# Patient Record
Sex: Female | Born: 1955 | Race: White | Hispanic: No | Smoking: Current every day smoker
Health system: Southern US, Community
[De-identification: ages and names within clinical notes are randomized; demographics above are authoritative.]

## PROBLEM LIST (undated history)

## (undated) DIAGNOSIS — R0683 Snoring: Secondary | ICD-10-CM

## (undated) DIAGNOSIS — J449 Chronic obstructive pulmonary disease, unspecified: Secondary | ICD-10-CM

## (undated) DIAGNOSIS — K219 Gastro-esophageal reflux disease without esophagitis: Secondary | ICD-10-CM

## (undated) DIAGNOSIS — J189 Pneumonia, unspecified organism: Secondary | ICD-10-CM

## (undated) DIAGNOSIS — E039 Hypothyroidism, unspecified: Secondary | ICD-10-CM

## (undated) DIAGNOSIS — F329 Major depressive disorder, single episode, unspecified: Secondary | ICD-10-CM

## (undated) DIAGNOSIS — J302 Other seasonal allergic rhinitis: Secondary | ICD-10-CM

## (undated) DIAGNOSIS — E785 Hyperlipidemia, unspecified: Secondary | ICD-10-CM

## (undated) DIAGNOSIS — M797 Fibromyalgia: Secondary | ICD-10-CM

## (undated) DIAGNOSIS — R079 Chest pain, unspecified: Secondary | ICD-10-CM

## (undated) DIAGNOSIS — F32A Depression, unspecified: Secondary | ICD-10-CM

## (undated) DIAGNOSIS — F419 Anxiety disorder, unspecified: Secondary | ICD-10-CM

## (undated) DIAGNOSIS — I82409 Acute embolism and thrombosis of unspecified deep veins of unspecified lower extremity: Secondary | ICD-10-CM

## (undated) DIAGNOSIS — J40 Bronchitis, not specified as acute or chronic: Secondary | ICD-10-CM

## (undated) HISTORY — PX: SHOULDER SURGERY: SHX246

## (undated) HISTORY — DX: Major depressive disorder, single episode, unspecified: F32.9

## (undated) HISTORY — PX: CHOLECYSTECTOMY: SHX55

## (undated) HISTORY — DX: Depression, unspecified: F32.A

## (undated) HISTORY — PX: CERVICAL SPINE SURGERY: SHX589

## (undated) HISTORY — DX: Other seasonal allergic rhinitis: J30.2

## (undated) HISTORY — PX: TOTAL ABDOMINAL HYSTERECTOMY: SHX209

## (undated) HISTORY — DX: Acute embolism and thrombosis of unspecified deep veins of unspecified lower extremity: I82.409

## (undated) HISTORY — PX: FOOT SURGERY: SHX648

## (undated) HISTORY — DX: Snoring: R06.83

## (undated) HISTORY — PX: TOTAL KNEE ARTHROPLASTY: SHX125

## (undated) HISTORY — PX: CARPAL TUNNEL RELEASE: SHX101

## (undated) HISTORY — DX: Hypothyroidism, unspecified: E03.9

## (undated) HISTORY — DX: Chest pain, unspecified: R07.9

## (undated) HISTORY — PX: KNEE ARTHROSCOPY: SUR90

## (undated) HISTORY — PX: ELBOW SURGERY: SHX618

## (undated) HISTORY — DX: Hyperlipidemia, unspecified: E78.5

---

## 2013-07-12 ENCOUNTER — Emergency Department (HOSPITAL_COMMUNITY)
Admission: EM | Admit: 2013-07-12 | Discharge: 2013-07-13 | Disposition: A | Discharge status: Home / Self Care | Payer: BC Managed Care – PPO | Attending: Emergency Medicine | Admitting: Emergency Medicine

## 2013-07-12 ENCOUNTER — Encounter (HOSPITAL_COMMUNITY): Payer: Self-pay | Admitting: Emergency Medicine

## 2013-07-12 DIAGNOSIS — E079 Disorder of thyroid, unspecified: Secondary | ICD-10-CM | POA: Insufficient documentation

## 2013-07-12 DIAGNOSIS — Y9339 Activity, other involving climbing, rappelling and jumping off: Secondary | ICD-10-CM | POA: Insufficient documentation

## 2013-07-12 DIAGNOSIS — Z8719 Personal history of other diseases of the digestive system: Secondary | ICD-10-CM | POA: Insufficient documentation

## 2013-07-12 DIAGNOSIS — F17200 Nicotine dependence, unspecified, uncomplicated: Secondary | ICD-10-CM | POA: Insufficient documentation

## 2013-07-12 DIAGNOSIS — S46909A Unspecified injury of unspecified muscle, fascia and tendon at shoulder and upper arm level, unspecified arm, initial encounter: Secondary | ICD-10-CM | POA: Insufficient documentation

## 2013-07-12 DIAGNOSIS — S0003XA Contusion of scalp, initial encounter: Secondary | ICD-10-CM | POA: Insufficient documentation

## 2013-07-12 DIAGNOSIS — S199XXA Unspecified injury of neck, initial encounter: Secondary | ICD-10-CM

## 2013-07-12 DIAGNOSIS — S1093XA Contusion of unspecified part of neck, initial encounter: Secondary | ICD-10-CM

## 2013-07-12 DIAGNOSIS — W1809XA Striking against other object with subsequent fall, initial encounter: Secondary | ICD-10-CM | POA: Insufficient documentation

## 2013-07-12 DIAGNOSIS — S0083XA Contusion of other part of head, initial encounter: Secondary | ICD-10-CM | POA: Insufficient documentation

## 2013-07-12 DIAGNOSIS — S0993XA Unspecified injury of face, initial encounter: Secondary | ICD-10-CM | POA: Insufficient documentation

## 2013-07-12 DIAGNOSIS — Y929 Unspecified place or not applicable: Secondary | ICD-10-CM | POA: Insufficient documentation

## 2013-07-12 DIAGNOSIS — IMO0002 Reserved for concepts with insufficient information to code with codable children: Secondary | ICD-10-CM | POA: Insufficient documentation

## 2013-07-12 DIAGNOSIS — S0990XA Unspecified injury of head, initial encounter: Secondary | ICD-10-CM

## 2013-07-12 DIAGNOSIS — S79929A Unspecified injury of unspecified thigh, initial encounter: Secondary | ICD-10-CM | POA: Insufficient documentation

## 2013-07-12 DIAGNOSIS — S79919A Unspecified injury of unspecified hip, initial encounter: Secondary | ICD-10-CM | POA: Insufficient documentation

## 2013-07-12 DIAGNOSIS — S4980XA Other specified injuries of shoulder and upper arm, unspecified arm, initial encounter: Secondary | ICD-10-CM | POA: Insufficient documentation

## 2013-07-12 DIAGNOSIS — Z79899 Other long term (current) drug therapy: Secondary | ICD-10-CM | POA: Insufficient documentation

## 2013-07-12 HISTORY — DX: Gastro-esophageal reflux disease without esophagitis: K21.9

## 2013-07-12 HISTORY — DX: Fibromyalgia: M79.7

## 2013-07-12 NOTE — ED Notes (Signed)
Pt tripped over a gate and fell face first in the wall, she complains of facial pain and knee pain, pt also complains of neck pain, had neck surgery in August.

## 2013-07-13 ENCOUNTER — Emergency Department (HOSPITAL_COMMUNITY): Payer: BC Managed Care – PPO

## 2013-07-13 MED ORDER — ONDANSETRON HCL 8 MG PO TABS: 8.0000 mg | ORAL_TABLET | Freq: Three times a day (TID) | ORAL | Status: DC | PRN

## 2013-07-13 MED ORDER — OXYCODONE-ACETAMINOPHEN 5-325 MG PO TABS: 1.0000 | ORAL_TABLET | ORAL | Status: DC | PRN

## 2013-07-13 MED ORDER — OXYCODONE-ACETAMINOPHEN 5-325 MG PO TABS
1.0000 | ORAL_TABLET | Freq: Once | ORAL | Status: AC
Start: 2013-07-13 — End: 2013-07-13
  Administered 2013-07-13: 1 via ORAL
  Filled 2013-07-13: qty 1

## 2013-07-13 MED ORDER — ONDANSETRON 8 MG PO TBDP
8.0000 mg | ORAL_TABLET | Freq: Once | ORAL | Status: AC
  Administered 2013-07-13: 8 mg via ORAL
  Filled 2013-07-13: qty 1

## 2013-07-13 NOTE — Discharge Instructions (Signed)
Use ice on the sore areas, 3 times a day. Return here, if needed, for problems.     Contusion A contusion is a deep bruise. Contusions are the result of an injury that caused bleeding under the skin. The contusion may turn blue, purple, or yellow. Minor injuries will give you a painless contusion, but more severe contusions may stay painful and swollen for a few weeks.  CAUSES  A contusion is usually caused by a blow, trauma, or direct force to an area of the body. SYMPTOMS   Swelling and redness of the injured area.  Bruising of the injured area.  Tenderness and soreness of the injured area.  Pain. DIAGNOSIS  The diagnosis can be made by taking a history and physical exam. An X-ray, CT scan, or MRI may be needed to determine if there were any associated injuries, such as fractures. TREATMENT  Specific treatment will depend on what area of the body was injured. In general, the best treatment for a contusion is resting, icing, elevating, and applying cold compresses to the injured area. Over-the-counter medicines may also be recommended for pain control. Ask your caregiver what the best treatment is for your contusion. HOME CARE INSTRUCTIONS   Put ice on the injured area.  Put ice in a plastic bag.  Place a towel between your skin and the bag.  Leave the ice on for 15-20 minutes, 03-04 times a day.  Only take over-the-counter or prescription medicines for pain, discomfort, or fever as directed by your caregiver. Your caregiver may recommend avoiding anti-inflammatory medicines (aspirin, ibuprofen, and naproxen) for 48 hours because these medicines may increase bruising.  Rest the injured area.  If possible, elevate the injured area to reduce swelling. SEEK IMMEDIATE MEDICAL CARE IF:   You have increased bruising or swelling.  You have pain that is getting worse.  Your swelling or pain is not relieved with medicines. MAKE SURE YOU:   Understand these instructions.  Will  watch your condition.  Will get help right away if you are not doing well or get worse. Document Released: 11/28/2004 Document Revised: 05/13/2011 Document Reviewed: 12/24/2010 [facility] Patient Information 2014 [place], [place].  Head Injury, Adult You have received a head injury. It does not appear serious at this time. Headaches and vomiting are common following head injury. It should be easy to awaken from sleeping. Sometimes it is necessary for you to stay in the emergency department for a while for observation. Sometimes admission to the hospital may be needed. After injuries such as yours, most problems occur within the first 24 hours, but side effects may occur up to 7 10 days after the injury. It is important for you to carefully monitor your condition and contact your health care provider or seek immediate medical care if there is a change in your condition. WHAT ARE THE TYPES OF HEAD INJURIES? Head injuries can be as minor as a bump. Some head injuries can be more severe. More severe head injuries include:  A jarring injury to the brain (concussion).  A bruise of the brain (contusion). This mean there is bleeding in the brain that can cause swelling.  A cracked skull (skull fracture).  Bleeding in the brain that collects, clots, and forms a bump (hematoma). WHAT CAUSES A HEAD INJURY? A serious head injury is most likely to happen to someone who is in a car wreck and is not wearing a seat belt. Other causes of major head injuries include bicycle or motorcycle accidents, sports  injuries, and falls. HOW ARE HEAD INJURIES DIAGNOSED? A complete history of the event leading to the injury and your current symptoms will be helpful in diagnosing head injuries. Many times, pictures of the brain, such as CT or MRI are needed to see the extent of the injury. Often, an overnight hospital stay is necessary for observation.  WHEN SHOULD I SEEK IMMEDIATE MEDICAL CARE?  You should get help right away  if:  You have confusion or drowsiness.  You feel sick to your stomach (nauseous) or have continued, forceful vomiting.  You have dizziness or unsteadiness that is getting worse.  You have severe, continued headaches not relieved by medicine. Only take over-the-counter or prescription medicines for pain, fever, or discomfort as directed by your health care provider.  You do not have normal function of the arms or legs or are unable to walk.  You notice changes in the black spots in the center of the colored part of your eye (pupil).  You have a clear or bloody fluid coming from your nose or ears.  You have a loss of vision. During the next 24 hours after the injury, you must stay with someone who can watch you for the warning signs. This person should contact local emergency services (911 in the [place] if you have seizures, you become unconscious, or you are unable to wake up. HOW CAN I PREVENT A HEAD INJURY IN THE FUTURE? The most important factor for preventing major head injuries is avoiding motor vehicle accidents. To minimize the potential for damage to your head, it is crucial to wear seat belts while riding in motor vehicles. Wearing helmets while bike riding and playing collision sports (like football) is also helpful. Also, avoiding dangerous activities around the house will further help reduce your risk of head injury.  WHEN CAN I RETURN TO NORMAL ACTIVITIES AND ATHLETICS? You should be reevaluated by your health care provider before returning to these activities. If you have any of the following symptoms, you should not return to activities or contact sports until 1 week after the symptoms have stopped:  Persistent headache.  Dizziness or vertigo.  Poor attention and concentration.  Confusion.  Memory problems.  Nausea or vomiting.  Fatigue or tire easily.  Irritability.  Intolerant of bright lights or loud noises.  Anxiety or depression.  Disturbed sleep. MAKE  SURE YOU:   Understand these instructions.  Will watch your condition.  Will get help right away if you are not doing well or get worse. Document Released: 02/18/2005 Document Revised: 12/09/2012 Document Reviewed: 10/26/2012 [facility] Patient Information 2014 [place].

## 2013-07-13 NOTE — ED Provider Notes (Signed)
CSN: [id number]     Arrival date & time 07/12/13  2318 History   First MD Initiated Contact with Patient 07/13/13 0151     Chief Complaint  Patient presents with  . Fall  . Facial Injury  . Headache     (Consider location/radiation/quality/duration/timing/severity/associated sxs/prior Treatment) Patient is a 58 y.o. female presenting with fall, facial injury, and headaches. The history is provided by the patient.  Fall Associated symptoms include headaches.  Facial Injury Associated symptoms: headaches   Headache  She reports that she was climbing over a dog gate, when she stumbled forward, striking her face and head on a wall. She injured her right shoulder and left knee in the fall. She has pain in her face for head and neck. She denies weakness, or paresthesias. She did not take anything for the pain. There are no other known modifying factors.  Past Medical History  Diagnosis Date  . Thyroid disease   . GERD (gastroesophageal reflux disease)   . Allergy   . Fibromyalgia    Past Surgical History  Procedure Laterality Date  . Neck surgery     History reviewed. No pertinent family history. History  Substance Use Topics  . Smoking status: Current Every Day Smoker  . Smokeless tobacco: Not on file  . Alcohol Use: No   OB History   Grav Para Term Preterm Abortions TAB SAB Ect Mult Living                 Review of Systems  Neurological: Positive for headaches.  All other systems reviewed and are negative.     Allergies  Tetracyclines & related  Home Medications   Prior to Admission medications   Medication Sig Start Date End Date Taking? Authorizing Provider  diazepam (VALIUM) 10 MG tablet Take 10 mg by mouth every 6 (six) hours as needed for anxiety.   Yes Historical Provider, MD  levothyroxine (SYNTHROID, LEVOTHROID) 100 MCG tablet Take 100 mcg by mouth daily before breakfast.   Yes Historical Provider, MD  oxyCODONE-acetaminophen (PERCOCET) 10-325 MG per  tablet Take 1 tablet by mouth every 4 (four) hours as needed for pain.   Yes Historical Provider, MD   BP 109/62  Pulse 57  Temp(Src) 99.8 F (37.7 C) (Oral)  Resp 16  SpO2 99% Physical Exam  Nursing note and vitals reviewed. Constitutional: She is oriented to person, place, and time. She appears well-developed and well-nourished. She appears distressed (she is uncomfortable).  HENT:  Head: Normocephalic and atraumatic.  Mild swelling of nose, no deformity, no bleeding. Tender cheeks bilaterally, without deformity or crepitation  Eyes: Conjunctivae and EOM are normal. Pupils are equal, round, and reactive to light. No scleral icterus.  No hyphema  Neck: Normal range of motion and phonation normal. Neck supple.  Cardiovascular: Normal rate, regular rhythm and intact distal pulses.   Pulmonary/Chest: Effort normal and breath sounds normal. No respiratory distress. She exhibits no tenderness.  Abdominal: Soft. She exhibits no distension. There is no tenderness. There is no guarding.  Musculoskeletal: Normal range of motion.  Normal range of motion of arms, and legs.  Neurological: She is alert and oriented to person, place, and time. She exhibits normal muscle tone.  Skin: Skin is warm and dry.  Superficial abrasions, small, nose, and left cheek  Psychiatric: She has a normal mood and affect. Her behavior is normal. Judgment and thought content normal.    ED Course  Procedures (including critical care time) Medications  oxyCODONE-acetaminophen (PERCOCET/ROXICET)  5-325 MG per tablet 1 tablet (1 tablet Oral Given 07/13/13 0236)  ondansetron (ZOFRAN-ODT) disintegrating tablet 8 mg (8 mg Oral Given 07/13/13 0236)    Patient Vitals for the past 24 hrs:  BP Temp Temp src Pulse Resp SpO2  07/12/13 2327 109/62 mmHg 99.8 F (37.7 C) Oral 57 16 99 %    3:33 AM Reevaluation with update and discussion. After initial assessment and treatment, an updated evaluation reveals she is comfortable.  No further complaints. Findings discussed with patient, all questions answered. [place] Review Labs Reviewed - No data to display  Imaging Review Ct Head Wo Contrast  07/13/2013   CLINICAL DATA:  Trip and fall, nasal injury with facial pain. Neck surgery August 2014.  EXAM: CT HEAD WITHOUT CONTRAST  CT MAXILLOFACIAL WITHOUT CONTRAST  CT CERVICAL SPINE WITHOUT CONTRAST  TECHNIQUE: Multidetector CT imaging of the head, cervical spine, and maxillofacial structures were performed using the standard protocol without intravenous contrast. Multiplanar CT image reconstructions of the cervical spine and maxillofacial structures were also generated.  COMPARISON:  None.  FINDINGS: CT HEAD FINDINGS  The ventricles and sulci are normal. No intraparenchymal hemorrhage, mass effect nor midline shift. No acute large vascular territory infarcts. Sub cm left inferior basilar ganglia perivascular space.  No abnormal extra-axial fluid collections. Basal cisterns are patent. No skull fracture.  CT MAXILLOFACIAL FINDINGS  The mandible is intact, the condyles are located.  Bilateral nasal bone fractures, mildly displaced on the right. No additional facial fractures. Absent ninth tooth.  Lobulated paranasal sinus mucosal thickening without air-fluid levels. Visualized mastoid air cells are well aerated. Nasal septum slightly deviated to the right with a tiny bony spur, left concha bullosa.  Mild midface soft tissue swelling without subcutaneous gas or radiopaque foreign bodies. Ocular globes and orbital contents are unremarkable.  CT CERVICAL SPINE FINDINGS  Status post C5-6 and C6-7 ACDF, partially incorporated interbody disc material, hardware appears intact and well seated without periprosthetic lucency. The vertebral bodies and posterior elements appear intact and aligned with straightened cervical lordosis. Mild C4-5 degenerative disc disease. No destructive bony lesions. 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 The included prevertebral paraspinal soft tissues are nonsuspicious.  IMPRESSION: CT head: No acute intracranial process ; normal noncontrast CT of the head for age.  CT maxillofacial: Bilateral nasal bone fractures, mildly displaced on the right. Absent ninth tooth may be acute.  Chronic paranasal sinusitis or possible superimposed polyposis.  CT cervical spine:  No acute fracture nor malalignment.  Status post C5-6 and C6-7 ACDF.   Electronically Signed   By: Elon Alas   On: 07/13/2013 03:03   Ct Cervical Spine Wo Contrast  07/13/2013   CLINICAL DATA:  Trip and fall, nasal injury with facial pain. Neck surgery August 2014.  EXAM: CT HEAD WITHOUT CONTRAST  CT MAXILLOFACIAL WITHOUT CONTRAST  CT CERVICAL SPINE WITHOUT CONTRAST  TECHNIQUE: Multidetector CT imaging of the head, cervical spine, and maxillofacial structures were performed using the standard protocol without intravenous contrast. Multiplanar CT image reconstructions of the cervical spine and maxillofacial structures were also generated.  COMPARISON:  None.  FINDINGS: CT HEAD FINDINGS  The ventricles and sulci are normal. No intraparenchymal hemorrhage, mass effect nor midline shift. No acute large vascular territory infarcts. Sub cm left inferior basilar ganglia perivascular space.  No abnormal extra-axial fluid collections. Basal cisterns are patent. No skull fracture.  CT MAXILLOFACIAL FINDINGS  The mandible is  intact, the condyles are located.  Bilateral nasal bone fractures, mildly displaced on the right. No additional facial fractures. Absent ninth tooth.  Lobulated paranasal sinus mucosal thickening without air-fluid levels. Visualized mastoid air cells are well aerated. Nasal septum slightly deviated to the right with a tiny bony spur, left concha bullosa.  Mild midface soft tissue swelling without subcutaneous gas or radiopaque foreign bodies. Ocular globes and orbital  contents are unremarkable.  CT CERVICAL SPINE FINDINGS  Status post C5-6 and C6-7 ACDF, partially incorporated interbody disc material, hardware appears intact and well seated without periprosthetic lucency. The vertebral bodies and posterior elements appear intact and aligned with straightened cervical lordosis. Mild C4-5 degenerative disc disease. No destructive bony lesions. Right C1 transverse process articulates with the skullbase with arthropathy, congenital variant. The included prevertebral paraspinal soft tissues are nonsuspicious.  IMPRESSION: CT head: No acute intracranial process ; normal noncontrast CT of the head for age.  CT maxillofacial: Bilateral nasal bone fractures, mildly displaced on the right. Absent ninth tooth may be acute.  Chronic paranasal sinusitis or possible superimposed polyposis.  CT cervical spine:  No acute fracture nor malalignment.  Status post C5-6 and C6-7 ACDF.   Electronically Signed   By: Elon Alas   On: 07/13/2013 03:03   Ct Maxillofacial Wo Cm  07/13/2013   CLINICAL DATA:  Trip and fall, nasal injury with facial pain. Neck surgery August 2014.  EXAM: CT HEAD WITHOUT CONTRAST  CT MAXILLOFACIAL WITHOUT CONTRAST  CT CERVICAL SPINE WITHOUT CONTRAST  TECHNIQUE: Multidetector CT imaging of the head, cervical spine, and maxillofacial structures were performed using the standard protocol without intravenous contrast. Multiplanar CT image reconstructions of the cervical spine and maxillofacial structures were also generated.  COMPARISON:  None.  FINDINGS: CT HEAD FINDINGS  The ventricles and sulci are normal. No intraparenchymal hemorrhage, mass effect nor midline shift. No acute large vascular territory infarcts. Sub cm left inferior basilar ganglia perivascular space.  No abnormal extra-axial fluid collections. Basal cisterns are patent. No skull fracture.  CT MAXILLOFACIAL FINDINGS  The mandible is intact, the condyles are located.  Bilateral nasal bone fractures,  mildly displaced on the right. No additional facial fractures. Absent ninth tooth.  Lobulated paranasal sinus mucosal thickening without air-fluid levels. Visualized mastoid air cells are well aerated. Nasal septum slightly deviated to the right with a tiny bony spur, left concha bullosa.  Mild midface soft tissue swelling without subcutaneous gas or radiopaque foreign bodies. Ocular globes and orbital contents are unremarkable.  CT CERVICAL SPINE FINDINGS  Status post C5-6 and C6-7 ACDF, partially incorporated interbody disc material, hardware appears intact and well seated without periprosthetic lucency. The vertebral bodies and posterior elements appear intact and aligned with straightened cervical lordosis. Mild C4-5 degenerative disc disease. No destructive bony lesions. Right C1 transverse process articulates with the skullbase with arthropathy, congenital variant. The included prevertebral paraspinal soft tissues are nonsuspicious.  IMPRESSION: CT head: No acute intracranial process ; normal noncontrast CT of the head for age.  CT maxillofacial: Bilateral nasal bone fractures, mildly displaced on the right. Absent ninth tooth may be acute.  Chronic paranasal sinusitis or possible superimposed polyposis.  CT cervical spine:  No acute fracture nor malalignment.  Status post C5-6 and C6-7 ACDF.   Electronically Signed   By: Elon Alas   On: 07/13/2013 03:03     EKG Interpretation None      MDM   Final diagnoses:  Head injury  Contusion of face  Neck injury  Mechanical fall, without, serious injury.   Nursing Notes Reviewed/ Care Coordinated Applicable Imaging Reviewed Interpretation of Laboratory Data incorporated into ED treatment  The patient appears reasonably screened and/or stabilized for discharge and I doubt any other medical condition or other [facility] requiring further screening, evaluation, or treatment in the ED at this time prior to discharge.  Plan: Home Medications-  usual; Home Treatments- cryotherapy; return here if the recommended treatment, does not improve the symptoms; Recommended follow up- return, if needed.    Richarda Blade, MD 07/13/13 [phone]

## 2013-07-13 NOTE — ED Notes (Signed)
Pt request Rx for pain and nausea, MD to be made aware.

## 2015-03-22 DIAGNOSIS — Z5321 Procedure and treatment not carried out due to patient leaving prior to being seen by health care provider: Secondary | ICD-10-CM | POA: Diagnosis not present

## 2015-03-22 DIAGNOSIS — I498 Other specified cardiac arrhythmias: Secondary | ICD-10-CM | POA: Diagnosis not present

## 2015-03-22 DIAGNOSIS — R0602 Shortness of breath: Secondary | ICD-10-CM | POA: Diagnosis not present

## 2015-03-23 DIAGNOSIS — F17200 Nicotine dependence, unspecified, uncomplicated: Secondary | ICD-10-CM | POA: Diagnosis not present

## 2015-03-23 DIAGNOSIS — F3289 Other specified depressive episodes: Secondary | ICD-10-CM | POA: Diagnosis not present

## 2015-03-23 DIAGNOSIS — Z98870 Personal history of in utero procedure during pregnancy: Secondary | ICD-10-CM | POA: Diagnosis not present

## 2015-03-23 DIAGNOSIS — Z9889 Other specified postprocedural states: Secondary | ICD-10-CM | POA: Diagnosis not present

## 2015-03-23 DIAGNOSIS — Z9049 Acquired absence of other specified parts of digestive tract: Secondary | ICD-10-CM | POA: Diagnosis not present

## 2015-03-28 DIAGNOSIS — F329 Major depressive disorder, single episode, unspecified: Secondary | ICD-10-CM | POA: Diagnosis not present

## 2015-03-28 DIAGNOSIS — G894 Chronic pain syndrome: Secondary | ICD-10-CM | POA: Diagnosis not present

## 2015-03-28 DIAGNOSIS — M5412 Radiculopathy, cervical region: Secondary | ICD-10-CM | POA: Diagnosis not present

## 2015-03-28 DIAGNOSIS — Z79899 Other long term (current) drug therapy: Secondary | ICD-10-CM | POA: Diagnosis not present

## 2015-03-28 DIAGNOSIS — F419 Anxiety disorder, unspecified: Secondary | ICD-10-CM | POA: Diagnosis not present

## 2015-04-04 DIAGNOSIS — F410 Panic disorder [episodic paroxysmal anxiety] without agoraphobia: Secondary | ICD-10-CM | POA: Diagnosis not present

## 2015-04-12 DIAGNOSIS — M5010 Cervical disc disorder with radiculopathy, unspecified cervical region: Secondary | ICD-10-CM | POA: Diagnosis not present

## 2015-04-12 DIAGNOSIS — M961 Postlaminectomy syndrome, not elsewhere classified: Secondary | ICD-10-CM | POA: Diagnosis not present

## 2015-04-12 DIAGNOSIS — M542 Cervicalgia: Secondary | ICD-10-CM | POA: Diagnosis not present

## 2015-04-12 DIAGNOSIS — M5013 Cervical disc disorder with radiculopathy, cervicothoracic region: Secondary | ICD-10-CM | POA: Diagnosis not present

## 2015-05-08 DIAGNOSIS — M5010 Cervical disc disorder with radiculopathy, unspecified cervical region: Secondary | ICD-10-CM | POA: Diagnosis not present

## 2015-05-08 DIAGNOSIS — M542 Cervicalgia: Secondary | ICD-10-CM | POA: Diagnosis not present

## 2015-05-08 DIAGNOSIS — M961 Postlaminectomy syndrome, not elsewhere classified: Secondary | ICD-10-CM | POA: Diagnosis not present

## 2015-05-18 DIAGNOSIS — Z5181 Encounter for therapeutic drug level monitoring: Secondary | ICD-10-CM | POA: Diagnosis not present

## 2015-05-18 DIAGNOSIS — Z79899 Other long term (current) drug therapy: Secondary | ICD-10-CM | POA: Diagnosis not present

## 2015-05-19 DIAGNOSIS — R079 Chest pain, unspecified: Secondary | ICD-10-CM | POA: Diagnosis not present

## 2015-05-19 DIAGNOSIS — G629 Polyneuropathy, unspecified: Secondary | ICD-10-CM | POA: Diagnosis not present

## 2015-05-19 DIAGNOSIS — R9431 Abnormal electrocardiogram [ECG] [EKG]: Secondary | ICD-10-CM | POA: Diagnosis not present

## 2015-05-19 DIAGNOSIS — F419 Anxiety disorder, unspecified: Secondary | ICD-10-CM | POA: Diagnosis not present

## 2015-05-19 DIAGNOSIS — Z1159 Encounter for screening for other viral diseases: Secondary | ICD-10-CM | POA: Diagnosis not present

## 2015-05-21 DIAGNOSIS — E785 Hyperlipidemia, unspecified: Secondary | ICD-10-CM | POA: Diagnosis not present

## 2015-05-21 DIAGNOSIS — R0789 Other chest pain: Secondary | ICD-10-CM | POA: Diagnosis not present

## 2015-05-21 DIAGNOSIS — Z981 Arthrodesis status: Secondary | ICD-10-CM | POA: Diagnosis not present

## 2015-05-21 DIAGNOSIS — R0602 Shortness of breath: Secondary | ICD-10-CM | POA: Diagnosis not present

## 2015-05-21 DIAGNOSIS — R079 Chest pain, unspecified: Secondary | ICD-10-CM | POA: Diagnosis not present

## 2015-05-21 DIAGNOSIS — E039 Hypothyroidism, unspecified: Secondary | ICD-10-CM | POA: Diagnosis not present

## 2015-05-21 DIAGNOSIS — J9811 Atelectasis: Secondary | ICD-10-CM | POA: Diagnosis not present

## 2015-05-23 DIAGNOSIS — M961 Postlaminectomy syndrome, not elsewhere classified: Secondary | ICD-10-CM | POA: Diagnosis not present

## 2015-05-23 DIAGNOSIS — M542 Cervicalgia: Secondary | ICD-10-CM | POA: Diagnosis not present

## 2015-05-23 DIAGNOSIS — M5013 Cervical disc disorder with radiculopathy, cervicothoracic region: Secondary | ICD-10-CM | POA: Diagnosis not present

## 2015-06-12 DIAGNOSIS — R9431 Abnormal electrocardiogram [ECG] [EKG]: Secondary | ICD-10-CM | POA: Diagnosis not present

## 2015-06-20 DIAGNOSIS — Z1212 Encounter for screening for malignant neoplasm of rectum: Secondary | ICD-10-CM | POA: Diagnosis not present

## 2015-06-20 DIAGNOSIS — Z1211 Encounter for screening for malignant neoplasm of colon: Secondary | ICD-10-CM | POA: Diagnosis not present

## 2015-06-20 DIAGNOSIS — F329 Major depressive disorder, single episode, unspecified: Secondary | ICD-10-CM | POA: Diagnosis not present

## 2015-06-20 DIAGNOSIS — E538 Deficiency of other specified B group vitamins: Secondary | ICD-10-CM | POA: Diagnosis not present

## 2015-06-20 DIAGNOSIS — Z1231 Encounter for screening mammogram for malignant neoplasm of breast: Secondary | ICD-10-CM | POA: Diagnosis not present

## 2015-06-20 DIAGNOSIS — G894 Chronic pain syndrome: Secondary | ICD-10-CM | POA: Diagnosis not present

## 2015-06-20 DIAGNOSIS — M797 Fibromyalgia: Secondary | ICD-10-CM | POA: Diagnosis not present

## 2015-06-20 DIAGNOSIS — G629 Polyneuropathy, unspecified: Secondary | ICD-10-CM | POA: Diagnosis not present

## 2015-06-23 DIAGNOSIS — Z1231 Encounter for screening mammogram for malignant neoplasm of breast: Secondary | ICD-10-CM | POA: Diagnosis not present

## 2015-08-07 DIAGNOSIS — F329 Major depressive disorder, single episode, unspecified: Secondary | ICD-10-CM | POA: Diagnosis not present

## 2015-08-07 DIAGNOSIS — G629 Polyneuropathy, unspecified: Secondary | ICD-10-CM | POA: Diagnosis not present

## 2015-08-07 DIAGNOSIS — E7800 Pure hypercholesterolemia, unspecified: Secondary | ICD-10-CM | POA: Diagnosis not present

## 2015-08-07 DIAGNOSIS — E039 Hypothyroidism, unspecified: Secondary | ICD-10-CM | POA: Diagnosis not present

## 2015-08-07 DIAGNOSIS — Z79899 Other long term (current) drug therapy: Secondary | ICD-10-CM | POA: Diagnosis not present

## 2015-08-07 DIAGNOSIS — G894 Chronic pain syndrome: Secondary | ICD-10-CM | POA: Diagnosis not present

## 2015-10-02 DIAGNOSIS — K580 Irritable bowel syndrome with diarrhea: Secondary | ICD-10-CM | POA: Diagnosis not present

## 2015-10-02 DIAGNOSIS — F329 Major depressive disorder, single episode, unspecified: Secondary | ICD-10-CM | POA: Diagnosis not present

## 2015-10-02 DIAGNOSIS — G894 Chronic pain syndrome: Secondary | ICD-10-CM | POA: Diagnosis not present

## 2015-11-22 DIAGNOSIS — M5136 Other intervertebral disc degeneration, lumbar region: Secondary | ICD-10-CM | POA: Diagnosis not present

## 2015-11-22 DIAGNOSIS — M797 Fibromyalgia: Secondary | ICD-10-CM | POA: Diagnosis not present

## 2015-11-22 DIAGNOSIS — G894 Chronic pain syndrome: Secondary | ICD-10-CM | POA: Diagnosis not present

## 2015-11-22 DIAGNOSIS — E039 Hypothyroidism, unspecified: Secondary | ICD-10-CM | POA: Diagnosis not present

## 2015-11-22 DIAGNOSIS — F411 Generalized anxiety disorder: Secondary | ICD-10-CM | POA: Diagnosis not present

## 2015-11-28 DIAGNOSIS — Z23 Encounter for immunization: Secondary | ICD-10-CM | POA: Diagnosis not present

## 2015-12-20 ENCOUNTER — Encounter (HOSPITAL_COMMUNITY): Payer: Self-pay | Admitting: Emergency Medicine

## 2015-12-20 ENCOUNTER — Emergency Department (HOSPITAL_COMMUNITY)
Admission: EM | Admit: 2015-12-20 | Discharge: 2015-12-20 | Disposition: A | Discharge status: Home / Self Care | Payer: Medicare Other | Attending: Emergency Medicine | Admitting: Emergency Medicine

## 2015-12-20 DIAGNOSIS — F1193 Opioid use, unspecified with withdrawal: Secondary | ICD-10-CM

## 2015-12-20 DIAGNOSIS — F1123 Opioid dependence with withdrawal: Secondary | ICD-10-CM | POA: Diagnosis present

## 2015-12-20 DIAGNOSIS — F17200 Nicotine dependence, unspecified, uncomplicated: Secondary | ICD-10-CM | POA: Diagnosis not present

## 2015-12-20 MED ORDER — CARBAMAZEPINE 200 MG PO TABS: ORAL_TABLET | ORAL | 0 refills | Status: DC

## 2015-12-20 MED ORDER — CARBAMAZEPINE 200 MG PO TABS
200.0000 mg | ORAL_TABLET | Freq: Once | ORAL | Status: AC
  Administered 2015-12-20: 200 mg via ORAL
  Filled 2015-12-20: qty 1

## 2015-12-20 NOTE — ED Provider Notes (Signed)
[facility] DEPT Provider Note   CSN: [id number] Arrival date & time: 12/20/15  1516  By signing my name below, I, Jeanell Sparrow, attest that this documentation has been prepared under the direction and in the presence of non-physician practitioner, Domenic Moras, PA-C. Electronically Signed: Jeanell Sparrow, Scribe. 12/20/2015. 5:10 PM.  History   Chief Complaint Chief Complaint  Patient presents with  . withdrawl   The history is provided by the patient. No language interpreter was used.   HPI Comments: Alexandra Harvey is a 60 y.o. female with a hx of fibromyalgia who presents to the Emergency Department complaining of opioid withdrawal. She reports that she has been taking pain medication for fibromyalgia and has been attempting to ween herself off of it. She came to the ED for help with her addiction. She reports associated symptoms of generalized myalgia, nausea, agitation, and visual hallucinations. She states that sleep usually relieves her fibromyalgia episodes. She denies any SI, HI, or alcohol abuse.   Past Medical History:  Diagnosis Date  . Allergy   . Fibromyalgia   . GERD (gastroesophageal reflux disease)   . Thyroid disease     There are no active problems to display for this patient.   Past Surgical History:  Procedure Laterality Date  . NECK SURGERY      OB History    No data available       Home Medications    Prior to Admission medications   Medication Sig Start Date End Date Taking? Authorizing Provider  carbamazepine (TEGRETOL) 200 MG tablet 800mg  PO QD X 1D, then 600mg  PO QD X 1D, then 400mg  QD X 1D, then 200mg  PO QD X 2D 12/20/15   Domenic Moras, PA-C  diazepam (VALIUM) 10 MG tablet Take 10 mg by mouth every 6 (six) hours as needed for anxiety.    Historical Provider, MD  levothyroxine (SYNTHROID, LEVOTHROID) 100 MCG tablet Take 100 mcg by mouth daily before breakfast.    Historical Provider, MD  ondansetron (ZOFRAN) 8 MG tablet Take 1 tablet (8 mg  total) by mouth every 8 (eight) hours as needed for nausea or vomiting. 07/13/13   Daleen Bo, MD  oxyCODONE-acetaminophen (PERCOCET) 10-325 MG per tablet Take 1 tablet by mouth every 4 (four) hours as needed for pain.    Historical Provider, MD  oxyCODONE-acetaminophen (PERCOCET) 5-325 MG per tablet Take 1 tablet by mouth every 4 (four) hours as needed for severe pain. 07/13/13   Daleen Bo, MD    Family History No family history on file.  Social History Social History  Substance Use Topics  . Smoking status: Current Every Day Smoker    Packs/day: 0.50  . Smokeless tobacco: Never Used  . Alcohol use No     Allergies   Tetracyclines & related   Review of Systems Review of Systems  Gastrointestinal: Positive for nausea.  Musculoskeletal: Positive for myalgias (Generalized).  Psychiatric/Behavioral: Positive for hallucinations (Visual). Negative for suicidal ideas.     Physical Exam Updated Vital Signs BP 151/81 (BP Location: Left Arm)   Pulse 115   Temp 98 F (36.7 C) (Oral)   Resp 18   Ht 5\' 7"  (1.702 m)   Wt 167 lb 8 oz (76 kg)   SpO2 99%   BMI 26.23 kg/m   Physical Exam  Constitutional: She appears well-developed and well-nourished. No distress.  HENT:  Head: Normocephalic.  Eyes: Conjunctivae are normal.  Neck: Neck supple.  Cardiovascular: Normal rate and regular rhythm.   Pulmonary/Chest: Effort  normal. No respiratory distress. She has no wheezes. She has no rales.  Abdominal: Soft.  Musculoskeletal: Normal range of motion.  Diffuse tenderness all throughout body to mild palpation.   Neurological: She is alert.  Skin: Skin is warm and dry.  Psychiatric: She has a normal mood and affect.  Nursing note and vitals reviewed.    ED Treatments / Results  DIAGNOSTIC STUDIES: Oxygen Saturation is 99% on RA, normal by my interpretation.    COORDINATION OF CARE: 5:20 PM- Pt advised of plan for treatment and pt agrees.  Labs (all labs ordered are  listed, but only abnormal results are displayed) Labs Reviewed - No data to display  EKG  EKG Interpretation None       Radiology No results found.  Procedures Procedures (including critical care time)  Medications Ordered in ED Medications - No data to display   Initial Impression / Assessment and Plan / ED Course  I have reviewed the triage vital signs and the nursing notes.  Pertinent labs & imaging results that were available during my care of the patient were reviewed by me and considered in my medical decision making (see chart for details).  Clinical Course    BP 151/81 (BP Location: Left Arm)   Pulse 115   Temp 98 F (36.7 C) (Oral)   Resp 18   Ht 5\' 7"  (1.702 m)   Wt 76 kg   SpO2 99%   BMI 26.23 kg/m    Final Clinical Impressions(s) / ED Diagnoses   Final diagnoses:  Opiate withdrawal (HCC)    New Prescriptions New Prescriptions   CARBAMAZEPINE (TEGRETOL) 200 MG TABLET    800mg  PO QD X 1D, then 600mg  PO QD X 1D, then 400mg  QD X 1D, then 200mg  PO QD X 2D   I personally performed the services described in this documentation, which was scribed in my presence. The recorded information has been reviewed and is accurate.   Pt request detox from opiate.  She is agreable to seek outpt care.  Will provide resources.  Tegretol given to help with withdrawal.    Domenic Moras, PA-C 12/21/15 [place], MD 12/22/15 1745

## 2015-12-20 NOTE — ED Triage Notes (Addendum)
Pt from home with complaints of opioid withdrawal. Pt states she wanted to not be addicted and she also just moved here and doesn't have a doctor that will prescribe her the pills. Pt states she stopped taking her valium and oxycodone 24 hours ago. Pt states she has a hx of fibromyalgia and "is currently having an attack". Pt is alert and oriented x 4 is not diaphoretic, but is tachycardic at 101 at time of assessment. Pt states she is having hallucinations of the floor moving and is "seeing colors", but denies SI and HI.  Pt immediately handed this RN a tin with her medications in them, then she asked for the tin back so she "could give it to the doctor to prove she hasn't taken them".

## 2015-12-22 ENCOUNTER — Encounter (HOSPITAL_COMMUNITY): Payer: Self-pay | Admitting: Emergency Medicine

## 2015-12-22 ENCOUNTER — Emergency Department (HOSPITAL_COMMUNITY)
Admission: EM | Admit: 2015-12-22 | Discharge: 2015-12-22 | Disposition: A | Discharge status: Home / Self Care | Payer: Medicare Other | Attending: Emergency Medicine | Admitting: Emergency Medicine

## 2015-12-22 DIAGNOSIS — F1120 Opioid dependence, uncomplicated: Secondary | ICD-10-CM | POA: Diagnosis not present

## 2015-12-22 DIAGNOSIS — Z79899 Other long term (current) drug therapy: Secondary | ICD-10-CM | POA: Insufficient documentation

## 2015-12-22 DIAGNOSIS — F17200 Nicotine dependence, unspecified, uncomplicated: Secondary | ICD-10-CM | POA: Diagnosis not present

## 2015-12-22 DIAGNOSIS — F419 Anxiety disorder, unspecified: Secondary | ICD-10-CM

## 2015-12-22 HISTORY — DX: Anxiety disorder, unspecified: F41.9

## 2015-12-22 MED ORDER — CLONIDINE HCL 0.1 MG PO TABS: ORAL_TABLET | ORAL | 0 refills | Status: DC

## 2015-12-22 NOTE — ED Notes (Signed)
Pfeiffer MD aware of pt status and complaint.

## 2015-12-22 NOTE — ED Notes (Signed)
Bed: [facility] Expected date:  Expected time:  Means of arrival:  Comments: 

## 2015-12-22 NOTE — ED Provider Notes (Signed)
[facility] DEPT Provider Note   CSN: [id number] Arrival date & time: 12/22/15  [id number]     History   Chief Complaint Chief Complaint  Patient presents with  . Detox  . Anxiety    HPI Alexandra Harvey is a 60 y.o. female.  HPI Patient states that she moved to this area in August. She states that her family provider will not prescribe for the Percocet and Valium that she has been taking chronically a pain management of fibromyalgia, she reports she had been taking them for years. She has been advised to see pain management. She states she has an appointment but is not until November 4. She reports that she has run out of her medications and she is going through withdrawal. She states she has chills, diarrhea and crampy abdominal pain. She also reports that she has run out of her Valium. I did ask her how many pills she moved here with since she reports having lived here for at least 2 months, but she never directly answered this question. She did say she has been spacing them out. Patient also reports that she is seeing things in her vision and feels dizzy. She reports that night she gets very anxious and feels that she is just going to jump out of her skin and her anxiety is just out of control. Past Medical History:  Diagnosis Date  . Allergy   . Anxiety   . Fibromyalgia   . GERD (gastroesophageal reflux disease)   . Thyroid disease     There are no active problems to display for this patient.   Past Surgical History:  Procedure Laterality Date  . NECK SURGERY      OB History    No data available       Home Medications    Prior to Admission medications   Medication Sig Start Date End Date Taking? Authorizing Provider  carbamazepine (TEGRETOL) 200 MG tablet 800mg  PO QD X 1D, then 600mg  PO QD X 1D, then 400mg  QD X 1D, then 200mg  PO QD X 2D 12/20/15  Yes Domenic Moras, PA-C  diazepam (VALIUM) 5 MG tablet Take 5 mg by mouth 3 (three) times daily.   Yes Historical Provider,  MD  DULoxetine (CYMBALTA) 30 MG capsule Take 30 mg by mouth 2 (two) times daily.   Yes Historical Provider, MD  lansoprazole (PREVACID) 30 MG capsule Take 30 mg by mouth 2 (two) times daily before a meal.   Yes Historical Provider, MD  levothyroxine (SYNTHROID, LEVOTHROID) 100 MCG tablet Take 100 mcg by mouth daily before breakfast.   Yes Historical Provider, MD  loratadine (CLARITIN) 10 MG tablet Take 10 mg by mouth daily.   Yes Historical Provider, MD  OVER THE COUNTER MEDICATION daily. Healthy detox smoothie....[place]   Yes Historical Provider, MD  oxyCODONE (OXY IR/ROXICODONE) 5 MG immediate release tablet Take 10 mg by mouth 4 (four) times daily.   Yes Historical Provider, MD  cloNIDine (CATAPRES) 0.1 MG tablet 1 tab po tid x 2 days, then bid x 2 days, then once daily x 2 days 12/22/15   Charlesetta Shanks, MD  ondansetron (ZOFRAN) 8 MG tablet Take 1 tablet (8 mg total) by mouth every 8 (eight) hours as needed for nausea or vomiting. Patient not taking: Reported on 12/22/2015 07/13/13   Daleen Bo, MD  oxyCODONE-acetaminophen (PERCOCET) 5-325 MG per tablet Take 1 tablet by mouth every 4 (four) hours as needed for severe pain. Patient not taking: Reported on 12/22/2015 07/13/13  Daleen Bo, MD    Family History No family history on file.  Social History Social History  Substance Use Topics  . Smoking status: Current Every Day Smoker    Packs/day: 0.50  . Smokeless tobacco: Never Used  . Alcohol use No     Allergies   Tetracyclines & related   Review of Systems Review of Systems 10 Systems reviewed and are negative for acute change except as noted in the HPI.   Physical Exam Updated Vital Signs BP 147/78 (BP Location: Left Arm)   Pulse 100   Temp 98.4 F (36.9 C) (Oral)   Resp 16   Ht 5\' 7"  (1.702 m)   Wt 167 lb (75.8 kg)   SpO2 99%   BMI 26.16 kg/m   Physical Exam  Constitutional: She appears well-developed and well-nourished.  No distress.  Patient is well dressed and groomed. She shows no signs of distress. There is no evidence of agitation. On the aggregate, patient appears slightly sedated and relaxed.  HENT:  Head: Normocephalic and atraumatic.  Right Ear: External ear normal.  Left Ear: External ear normal.  Nose: Nose normal.  Mouth/Throat: Oropharynx is clear and moist.  Eyes: Conjunctivae and EOM are normal.  Neck: Neck supple.  Cardiovascular: Normal rate and regular rhythm.   No murmur heard. Pulmonary/Chest: Effort normal and breath sounds normal. No respiratory distress.  Abdominal: Soft. She exhibits no distension. There is tenderness.  Patient endorses tenderness to palpation of her lower abdomen but there is no guarding or pain response to the exam.  Musculoskeletal: Normal range of motion. She exhibits no edema or deformity.  Patient sits and stands without difficulty. I've examined extremities for signs of injection drug use. No evident tract marks or abscesses. Lower extremity are in excellent Condition without edema. Skin condition very good.  Neurological: She is alert. No cranial nerve deficit. She exhibits normal muscle tone. Coordination normal.  Skin: Skin is warm and dry.  Psychiatric: She has a normal mood and affect.  The patient reports being very anxious. She is calm and relaxed objectively speaking.  Nursing note and vitals reviewed.    ED Treatments / Results  Labs (all labs ordered are listed, but only abnormal results are displayed) Labs Reviewed - No data to display  EKG  EKG Interpretation None       Radiology No results found.  Procedures Procedures (including critical care time)  Medications Ordered in ED Medications - No data to display   Initial Impression / Assessment and Plan / ED Course  I have reviewed the triage vital signs and the nursing notes.  Pertinent labs & imaging results that were available during my care of the patient were reviewed by  me and considered in my medical decision making (see chart for details).  Clinical Course    Final Clinical Impressions(s) / ED Diagnoses   Final diagnoses:  Anxiety  Narcotic dependency, continuous ([place])  Patient does not show signs of acute withdrawal from benzodiazepines. She is not agitated or tremulous. Heart rate is regular without tachycardia. Patient is not diaphoretic. She reports having lived here for 2 months. If she has been spacing out her use in order to make her medications last, she should not be at risk for significant benzodiazepine withdrawal. Patient will be given clonidine for reports of opioid withdrawal with cramping abdominal pain, diarrhea and chills. Patient does not clinically appear ill. I do not suspect infectious etiology. Patient is given resources for outpatient treatment of  substance dependency.  New Prescriptions New Prescriptions   CLONIDINE (CATAPRES) 0.1 MG TABLET    1 tab po tid x 2 days, then bid x 2 days, then once daily x 2 days     Charlesetta Shanks, MD 12/22/15 1148

## 2015-12-22 NOTE — ED Triage Notes (Signed)
Pt presents to ED with complaint of opiate withdrawal and anxiety with request for detox. Pt seen 12/20/15 for same. Pt states PCP refuses to see her and pain management appointment is 01/03/16. Pt reports associated nausea, diarrhea, goose bumps, tingling/numbness to hands/lips, and seeing random colors. Pt denies SI/HI. Pt last took half of prescribed opiate and benzo last night.

## 2015-12-27 DIAGNOSIS — Z716 Tobacco abuse counseling: Secondary | ICD-10-CM | POA: Diagnosis not present

## 2015-12-27 DIAGNOSIS — F17210 Nicotine dependence, cigarettes, uncomplicated: Secondary | ICD-10-CM | POA: Diagnosis not present

## 2015-12-27 DIAGNOSIS — M5412 Radiculopathy, cervical region: Secondary | ICD-10-CM | POA: Diagnosis not present

## 2015-12-27 DIAGNOSIS — M791 Myalgia: Secondary | ICD-10-CM | POA: Diagnosis not present

## 2016-01-03 DIAGNOSIS — M47816 Spondylosis without myelopathy or radiculopathy, lumbar region: Secondary | ICD-10-CM | POA: Diagnosis not present

## 2016-01-03 DIAGNOSIS — M5137 Other intervertebral disc degeneration, lumbosacral region: Secondary | ICD-10-CM | POA: Diagnosis not present

## 2016-01-03 DIAGNOSIS — Z79899 Other long term (current) drug therapy: Secondary | ICD-10-CM | POA: Diagnosis not present

## 2016-01-03 DIAGNOSIS — M797 Fibromyalgia: Secondary | ICD-10-CM | POA: Diagnosis not present

## 2016-01-03 DIAGNOSIS — G894 Chronic pain syndrome: Secondary | ICD-10-CM | POA: Diagnosis not present

## 2016-01-03 DIAGNOSIS — Z79891 Long term (current) use of opiate analgesic: Secondary | ICD-10-CM | POA: Diagnosis not present

## 2016-01-04 ENCOUNTER — Other Ambulatory Visit: Payer: Self-pay | Admitting: Pain Medicine

## 2016-01-04 DIAGNOSIS — M545 Low back pain: Principal | ICD-10-CM

## 2016-01-04 DIAGNOSIS — G8929 Other chronic pain: Secondary | ICD-10-CM

## 2016-01-09 ENCOUNTER — Other Ambulatory Visit: Payer: Self-pay | Admitting: Pain Medicine

## 2016-01-09 DIAGNOSIS — M542 Cervicalgia: Secondary | ICD-10-CM

## 2016-01-09 DIAGNOSIS — M545 Low back pain: Principal | ICD-10-CM

## 2016-01-09 DIAGNOSIS — G8929 Other chronic pain: Secondary | ICD-10-CM

## 2016-01-20 ENCOUNTER — Ambulatory Visit
Admission: RE | Admit: 2016-01-20 | Discharge: 2016-01-20 | Disposition: A | Discharge status: Home / Self Care | Payer: Medicare Other | Source: Ambulatory Visit | Attending: Pain Medicine | Admitting: Pain Medicine

## 2016-01-20 DIAGNOSIS — G8929 Other chronic pain: Secondary | ICD-10-CM

## 2016-01-20 DIAGNOSIS — M5126 Other intervertebral disc displacement, lumbar region: Secondary | ICD-10-CM | POA: Diagnosis not present

## 2016-01-20 DIAGNOSIS — M545 Low back pain: Principal | ICD-10-CM

## 2016-01-20 DIAGNOSIS — M4602 Spinal enthesopathy, cervical region: Secondary | ICD-10-CM | POA: Diagnosis not present

## 2016-01-20 DIAGNOSIS — M542 Cervicalgia: Secondary | ICD-10-CM

## 2016-01-31 DIAGNOSIS — Z79891 Long term (current) use of opiate analgesic: Secondary | ICD-10-CM | POA: Diagnosis not present

## 2016-01-31 DIAGNOSIS — M4692 Unspecified inflammatory spondylopathy, cervical region: Secondary | ICD-10-CM | POA: Diagnosis not present

## 2016-01-31 DIAGNOSIS — G894 Chronic pain syndrome: Secondary | ICD-10-CM | POA: Diagnosis not present

## 2016-01-31 DIAGNOSIS — M5137 Other intervertebral disc degeneration, lumbosacral region: Secondary | ICD-10-CM | POA: Diagnosis not present

## 2016-01-31 DIAGNOSIS — M542 Cervicalgia: Secondary | ICD-10-CM | POA: Diagnosis not present

## 2016-01-31 DIAGNOSIS — Z79899 Other long term (current) drug therapy: Secondary | ICD-10-CM | POA: Diagnosis not present

## 2016-02-06 DIAGNOSIS — M47812 Spondylosis without myelopathy or radiculopathy, cervical region: Secondary | ICD-10-CM | POA: Diagnosis not present

## 2016-02-14 DIAGNOSIS — G479 Sleep disorder, unspecified: Secondary | ICD-10-CM | POA: Diagnosis not present

## 2016-02-14 DIAGNOSIS — M797 Fibromyalgia: Secondary | ICD-10-CM | POA: Diagnosis not present

## 2016-03-01 DIAGNOSIS — Z79891 Long term (current) use of opiate analgesic: Secondary | ICD-10-CM | POA: Diagnosis not present

## 2016-03-01 DIAGNOSIS — G894 Chronic pain syndrome: Secondary | ICD-10-CM | POA: Diagnosis not present

## 2016-03-01 DIAGNOSIS — Z79899 Other long term (current) drug therapy: Secondary | ICD-10-CM | POA: Diagnosis not present

## 2016-03-01 DIAGNOSIS — M5137 Other intervertebral disc degeneration, lumbosacral region: Secondary | ICD-10-CM | POA: Diagnosis not present

## 2016-03-01 DIAGNOSIS — M797 Fibromyalgia: Secondary | ICD-10-CM | POA: Diagnosis not present

## 2016-03-01 DIAGNOSIS — M4692 Unspecified inflammatory spondylopathy, cervical region: Secondary | ICD-10-CM | POA: Diagnosis not present

## 2016-03-19 DIAGNOSIS — M47812 Spondylosis without myelopathy or radiculopathy, cervical region: Secondary | ICD-10-CM | POA: Diagnosis not present

## 2016-04-01 DIAGNOSIS — M5137 Other intervertebral disc degeneration, lumbosacral region: Secondary | ICD-10-CM | POA: Diagnosis not present

## 2016-04-01 DIAGNOSIS — M797 Fibromyalgia: Secondary | ICD-10-CM | POA: Diagnosis not present

## 2016-04-01 DIAGNOSIS — M4692 Unspecified inflammatory spondylopathy, cervical region: Secondary | ICD-10-CM | POA: Diagnosis not present

## 2016-04-01 DIAGNOSIS — Z79891 Long term (current) use of opiate analgesic: Secondary | ICD-10-CM | POA: Diagnosis not present

## 2016-04-01 DIAGNOSIS — G894 Chronic pain syndrome: Secondary | ICD-10-CM | POA: Diagnosis not present

## 2016-04-01 DIAGNOSIS — Z79899 Other long term (current) drug therapy: Secondary | ICD-10-CM | POA: Diagnosis not present

## 2016-04-10 ENCOUNTER — Encounter (HOSPITAL_COMMUNITY): Payer: Self-pay | Admitting: *Deleted

## 2016-04-10 ENCOUNTER — Emergency Department (HOSPITAL_COMMUNITY): Payer: Medicare Other

## 2016-04-10 ENCOUNTER — Emergency Department (HOSPITAL_COMMUNITY)
Admission: EM | Admit: 2016-04-10 | Discharge: 2016-04-11 | Disposition: A | Discharge status: Home / Self Care | Payer: Medicare Other | Attending: Physician Assistant | Admitting: Physician Assistant

## 2016-04-10 DIAGNOSIS — R42 Dizziness and giddiness: Secondary | ICD-10-CM | POA: Diagnosis not present

## 2016-04-10 DIAGNOSIS — R55 Syncope and collapse: Secondary | ICD-10-CM | POA: Diagnosis not present

## 2016-04-10 DIAGNOSIS — R197 Diarrhea, unspecified: Secondary | ICD-10-CM | POA: Diagnosis not present

## 2016-04-10 DIAGNOSIS — R0602 Shortness of breath: Secondary | ICD-10-CM | POA: Diagnosis not present

## 2016-04-10 DIAGNOSIS — F17200 Nicotine dependence, unspecified, uncomplicated: Secondary | ICD-10-CM | POA: Diagnosis not present

## 2016-04-10 LAB — CBC
HCT: 42.4 % (ref 36.0–46.0)
Hemoglobin: 14.2 g/dL (ref 12.0–15.0)
MCH: 29.5 pg (ref 26.0–34.0)
MCHC: 33.5 g/dL (ref 30.0–36.0)
MCV: 88.1 fL (ref 78.0–100.0)
Platelets: 349 10*3/uL (ref 150–400)
RBC: 4.81 MIL/uL (ref 3.87–5.11)
RDW: 13.1 % (ref 11.5–15.5)
WBC: 9 10*3/uL (ref 4.0–10.5)

## 2016-04-10 LAB — BASIC METABOLIC PANEL
Anion gap: 11 (ref 5–15)
BUN: 8 mg/dL (ref 6–20)
CHLORIDE: 106 mmol/L (ref 101–111)
CO2: 22 mmol/L (ref 22–32)
CREATININE: 0.7 mg/dL (ref 0.44–1.00)
Calcium: 9.7 mg/dL (ref 8.9–10.3)
GFR calc Af Amer: >60 mL/min (ref >60)
GFR calc non Af Amer: >60 mL/min (ref >60)
GLUCOSE: 105 mg/dL — AB (ref 65–99)
Potassium: 3.9 mmol/L (ref 3.5–5.1)
Sodium: 139 mmol/L (ref 135–145)

## 2016-04-10 LAB — URINALYSIS, ROUTINE W REFLEX MICROSCOPIC
Bilirubin Urine: NEGATIVE
GLUCOSE, UA: NEGATIVE mg/dL
Hgb urine dipstick: NEGATIVE
Ketones, ur: NEGATIVE mg/dL
Nitrite: NEGATIVE
Protein, ur: NEGATIVE mg/dL
Specific Gravity, Urine: 1.003 — ABNORMAL LOW (ref 1.005–1.030)
pH: 6 (ref 5.0–8.0)

## 2016-04-10 MED ORDER — MECLIZINE HCL 25 MG PO TABS
25.0000 mg | ORAL_TABLET | Freq: Once | ORAL | Status: AC
  Administered 2016-04-10: 25 mg via ORAL
  Filled 2016-04-10: qty 1

## 2016-04-10 MED ORDER — MECLIZINE HCL 25 MG PO TABS: 25.0000 mg | ORAL_TABLET | Freq: Three times a day (TID) | ORAL | 0 refills | Status: DC | PRN

## 2016-04-10 NOTE — Discharge Instructions (Signed)
Please read attached information. If you experience any new or worsening signs or symptoms please return to the emergency room for evaluation. Please follow-up with your primary care provider or specialist as discussed. Please use medication prescribed only as directed and discontinue taking if you have any concerning signs or symptoms.   °

## 2016-04-10 NOTE — ED Triage Notes (Signed)
Pt reports having syncopal episode several days ago and had near syncopal episode today. Went to pcp and sent here for further eval of abnormal ekg. Denies cp or sob. HR 110 at triage.

## 2016-04-10 NOTE — ED Provider Notes (Signed)
[facility] DEPT Provider Note   CSN: [id number] Arrival date & time: 04/10/16  1620     History   Chief Complaint Chief Complaint  Patient presents with  . Loss of Consciousness    HPI Alexandra Harvey is a 61 y.o. female.  HPI  61 year old female presents today with numerous complaints. Patient reports approximately 3 weeks ago she's had numerous episodes of her heart racing. She notes her pulse rate would be up in the 100's. She notes approximately one week ago she was standing when she had an abnormal sensation and woke up on the ground. Bystander noted no seizure-like activity, she had an episode of vomiting after the incident. Patient notes she had resolution of her symptoms for several days, but notes that 3 days ago she had an episode where she felt like she was going to pass out again. She notes this was going from sitting to standing, she denied any associated dizziness originally. Later on in questioning patient reports that she did have dizziness upon standing but notes it did not feel like her previous episodes of vertigo. Patient notes she intermittently has tingling in the bilateral face and upper extremities, no strength deficits. Patient notes that she's had diarrhea for the last 3 weeks, but then later on in questioning reported that he did only been having diarrhea for several days. Patient notes that she has no dizziness at baseline, again later on in questioning reports that she is having dizziness even sitting in the exam bed. Patient denies any recent antibiotic exposure, she reports she is eating and drinking appropriately.    Past Medical History:  Diagnosis Date  . Allergy   . Anxiety   . Fibromyalgia   . GERD (gastroesophageal reflux disease)   . Thyroid disease     There are no active problems to display for this patient.   Past Surgical History:  Procedure Laterality Date  . NECK SURGERY      OB History    No data available       Home  Medications    Prior to Admission medications   Medication Sig Start Date End Date Taking? Authorizing Provider  carbamazepine (TEGRETOL) 200 MG tablet 800mg  PO QD X 1D, then 600mg  PO QD X 1D, then 400mg  QD X 1D, then 200mg  PO QD X 2D 12/20/15   Domenic Moras, PA-C  cloNIDine (CATAPRES) 0.1 MG tablet 1 tab po tid x 2 days, then bid x 2 days, then once daily x 2 days 12/22/15   Charlesetta Shanks, MD  diazepam (VALIUM) 5 MG tablet Take 5 mg by mouth 3 (three) times daily.    Historical Provider, MD  DULoxetine (CYMBALTA) 30 MG capsule Take 30 mg by mouth 2 (two) times daily.    Historical Provider, MD  lansoprazole (PREVACID) 30 MG capsule Take 30 mg by mouth 2 (two) times daily before a meal.    Historical Provider, MD  levothyroxine (SYNTHROID, LEVOTHROID) 100 MCG tablet Take 100 mcg by mouth daily before breakfast.    Historical Provider, MD  loratadine (CLARITIN) 10 MG tablet Take 10 mg by mouth daily.    Historical Provider, MD  meclizine (ANTIVERT) 25 MG tablet Take 1 tablet (25 mg total) by mouth 3 (three) times daily as needed for dizziness. 04/10/16   Okey Regal, PA-C  ondansetron (ZOFRAN) 8 MG tablet Take 1 tablet (8 mg total) by mouth every 8 (eight) hours as needed for nausea or vomiting. Patient not taking: Reported on 12/22/2015 07/13/13  Daleen Bo, MD  OVER THE COUNTER MEDICATION daily. Healthy detox smoothie....[place]    Historical Provider, MD  oxyCODONE (OXY IR/ROXICODONE) 5 MG immediate release tablet Take 10 mg by mouth 4 (four) times daily.    Historical Provider, MD  oxyCODONE-acetaminophen (PERCOCET) 5-325 MG per tablet Take 1 tablet by mouth every 4 (four) hours as needed for severe pain. Patient not taking: Reported on 12/22/2015 07/13/13   Daleen Bo, MD    Family History History reviewed. No pertinent family history.  Social History Social History  Substance Use Topics  . Smoking status: Current Every Day Smoker    Packs/day:  0.50  . Smokeless tobacco: Never Used  . Alcohol use No     Allergies   Tetracyclines & related   Review of Systems Review of Systems  All other systems reviewed and are negative.    Physical Exam Updated Vital Signs BP 150/88   Pulse 83   Temp 98.6 F (37 C) (Oral)   Resp 20   Ht 5\' 7"  (1.702 m)   Wt 87.1 kg   SpO2 100%   BMI 30.07 kg/m   Physical Exam  Constitutional: She is oriented to person, place, and time. She appears well-developed and well-nourished.  HENT:  Head: Normocephalic and atraumatic.  Eyes: Conjunctivae are normal. Pupils are equal, round, and reactive to light. Right eye exhibits no discharge. Left eye exhibits no discharge. No scleral icterus.  Neck: Normal range of motion. No JVD present. No tracheal deviation present.  Pulmonary/Chest: Effort normal. No stridor.  Neurological: She is alert and oriented to person, place, and time. She has normal strength. No cranial nerve deficit or sensory deficit. Coordination normal. GCS eye subscore is 4. GCS verbal subscore is 5. GCS motor subscore is 6.  Finger to nose normal, heel to shin normal- no nystagmus noted, negative head and pulse test  Psychiatric: She has a normal mood and affect. Her behavior is normal. Judgment and thought content normal.  Nursing note and vitals reviewed.    ED Treatments / Results  Labs (all labs ordered are listed, but only abnormal results are displayed) Labs Reviewed  BASIC METABOLIC PANEL - Abnormal; Notable for the following:       Result Value   Glucose, Bld 105 (*)    All other components within normal limits  URINALYSIS, ROUTINE W REFLEX MICROSCOPIC - Abnormal; Notable for the following:    Color, Urine STRAW (*)    Specific Gravity, Urine 1.003 (*)    Leukocytes, UA SMALL (*)    Bacteria, UA RARE (*)    Squamous Epithelial / LPF 0-5 (*)    All other components within normal limits  CBC    EKG  EKG Interpretation None       Radiology Dg Chest 2  View  Result Date: 04/10/2016 CLINICAL DATA:  Syncopal episode 3 days ago, abnormal EKG, presyncopal again today with dizziness, lip numbness, shortness of breath, waking up at night unable debris, tachycardia, diarrhea for 3 weeks, history fibromyalgia and smoking EXAM: CHEST  2 VIEW COMPARISON:  None FINDINGS: Upper normal heart size. Mediastinal contours and pulmonary vascularity normal. Lungs clear. No pleural effusion or pneumothorax. Bones demineralized with evidence of prior cervical spine fusion. IMPRESSION: No acute abnormalities. Electronically Signed   By: Lavonia Dana M.D.   On: 04/10/2016 17:26    Procedures Procedures (including critical care time)  Medications Ordered in ED Medications  meclizine (ANTIVERT) tablet 25 mg (25 mg Oral Given 04/10/16  2214)     Initial Impression / Assessment and Plan / ED Course  I have reviewed the triage vital signs and the nursing notes.  Pertinent labs & imaging results that were available during my care of the patient were reviewed by me and considered in my medical decision making (see chart for details).      Final Clinical Impressions(s) / ED Diagnoses   Final diagnoses:  Dizziness    Labs: Urinalysis, BMP, CBC  Imaging: DG chest 2 view, ED EKG  Consults:  Therapeutics: Meclizine  Discharge Meds: Meclizine  Assessment/Plan: 61 year old female presents today with numerous complaints. Patient's complaints vary induration throughout Y assessment. Patient having dizziness, this is worsened with head to head movements, sitting to standing. She has no signs of dehydration, reassuring vital signs, reassuring laboratory analysis. Patient has no findings on her exam that would indicate a central cause of her dizziness, likely peripheral vertigo in nature. During disposition discussion patient notes that the dizziness has been coming and going for longer than she initially indicated reporting this is been going on for months. I personally  ambulated the patient in the hall she had a steady gait. Patient will be referred to cardiology for her complaints of tachycardia, referred to neurology for her paresthesias and dizziness, and encouraged to return to emergency room immediately if she has any new or worsening signs or symptoms. Patient verbalized her understanding and agreement to today's plan had no further questions or concerns      New Prescriptions New Prescriptions   MECLIZINE (ANTIVERT) 25 MG TABLET    Take 1 tablet (25 mg total) by mouth 3 (three) times daily as needed for dizziness.     Okey Regal, PA-C 04/10/16 [place], MD 04/11/16 [id number]

## 2016-04-15 ENCOUNTER — Ambulatory Visit: Payer: Self-pay | Admitting: Neurology

## 2016-04-16 DIAGNOSIS — M47812 Spondylosis without myelopathy or radiculopathy, cervical region: Secondary | ICD-10-CM | POA: Diagnosis not present

## 2016-04-17 ENCOUNTER — Ambulatory Visit (INDEPENDENT_AMBULATORY_CARE_PROVIDER_SITE_OTHER): Payer: Medicare Other | Admitting: Nurse Practitioner

## 2016-04-17 ENCOUNTER — Encounter: Payer: Self-pay | Admitting: Nurse Practitioner

## 2016-04-17 VITALS — BP 137/80 | HR 124 | Ht 67.0 in | Wt 195.6 lb

## 2016-04-17 DIAGNOSIS — E785 Hyperlipidemia, unspecified: Secondary | ICD-10-CM

## 2016-04-17 DIAGNOSIS — Z720 Tobacco use: Secondary | ICD-10-CM

## 2016-04-17 DIAGNOSIS — R000 Tachycardia, unspecified: Secondary | ICD-10-CM | POA: Diagnosis not present

## 2016-04-17 DIAGNOSIS — R002 Palpitations: Secondary | ICD-10-CM | POA: Diagnosis not present

## 2016-04-17 DIAGNOSIS — R55 Syncope and collapse: Secondary | ICD-10-CM | POA: Diagnosis not present

## 2016-04-17 DIAGNOSIS — R0683 Snoring: Secondary | ICD-10-CM

## 2016-04-17 DIAGNOSIS — Z79899 Other long term (current) drug therapy: Secondary | ICD-10-CM | POA: Diagnosis not present

## 2016-04-17 NOTE — Patient Instructions (Signed)
Medication Instructions:  Your physician recommends that you continue on your current medications as directed. Please refer to the Current Medication list given to you today.  Labwork: Please return tomorrow to have FASTING blood work (TSH, Ddimer, Lipids, Hepatic)  Testing/Procedures: Your physician has recommended that you wear a 30 day event monitor. Event monitors are medical devices that record the heart's electrical activity. Doctors most often [place] these monitors to diagnose arrhythmias. Arrhythmias are problems with the speed or rhythm of the heartbeat. The monitor is a small, portable device. You can wear one while you do your normal daily activities. This is usually used to diagnose what is causing palpitations/syncope (passing out).  Your physician has requested that you have an echocardiogram. Echocardiography is a painless test that uses sound waves to create images of your heart. It provides your doctor with information about the size and shape of your heart and how well your heart's chambers and valves are working. This procedure takes approximately one hour. There are no restrictions for this procedure.  Your physician has recommended that you have a sleep study. This test records several body functions during sleep, including: brain activity, eye movement, oxygen and carbon dioxide blood levels, heart rate and rhythm, breathing rate and rhythm, the flow of air through your mouth and nose, snoring, body muscle movements, and chest and belly movement.  Follow-Up: Your physician recommends that you schedule a follow-up appointment in: 1 month with Ignacia Bayley NP or Dr. Ellyn Hack.   Any Other Special Instructions Will Be Listed Below (If Applicable).     If you need a refill on your cardiac medications before your next appointment, please call your pharmacy.

## 2016-04-17 NOTE — Progress Notes (Signed)
Cardiology Clinic Note   Patient Name: Alexandra Harvey Date of Encounter: 04/17/2016  Primary Care Provider:  Aretta Nip, MD Primary Cardiologist:  New - will f/u with Roni Bread, MD   Patient Profile    61 year old female with a prior history of fibromyalgia, hyperlipidemia, depression, and anxiety, who presents for evaluation related to palpitations and syncope.  Past Medical History    Past Medical History:  Diagnosis Date  . Anxiety   . Chest pain    a. reports 3 nl stress tests in [place] over past 10 years.  . Depression   . DVT (deep venous thrombosis) ([place])    a. 2016 R DVT after knee surgery  tx w/ coumadin x 2 mos.  . Fibromyalgia   . GERD (gastroesophageal reflux disease)   . Hyperlipidemia   . Hypothyroidism   . Seasonal allergies   . Snoring    a. sometimes awakes @ night gasping.   Past Surgical History:  Procedure Laterality Date  . CARPAL TUNNEL RELEASE Right   . CERVICAL SPINE SURGERY    . CHOLECYSTECTOMY    . ELBOW SURGERY Right   . FOOT SURGERY Right   . KNEE ARTHROSCOPY Bilateral   . SHOULDER SURGERY Right   . TOTAL ABDOMINAL HYSTERECTOMY    . TOTAL KNEE ARTHROPLASTY Right     Allergies  Allergies  Allergen Reactions  . Tetracyclines & Related Rash    History of Present Illness    61 year old female with a prior history of fibromyalgia, hyperlipidemia, tobacco abuse, marijuana usage, anxiety, depression, and GERD who recently moved to [place] over the summer of 2017 from [place]. She says that while in [place], she had at least 3 different stress tests over a period of about 10 years related to chest pain. She never required any further cardiac evaluation. She also has a history of right lower extremity DVT following right knee surgery in 2016. She was on Coumadin for about 2 months afterward. She has no sequelae related to this. She moved to [place] in the summer of 2017 and at that time had been taking Valium and  oxycodone long-term. She was not able to find a prescriber for these here and was referred to pain clinic. Eventually, she required detoxification which appears to have been carried out through ER visits. She has not taken benzos or narcotics since last August.  At the time of her detox, she did note intermittent tachypalpitations associated with mild dyspnea. This waxed and waned over the fall but over the past few months, she has noticed more frequent episodes of palpitations associated with mild dyspnea, lasting about 5 minutes, and resolving spontaneously. Palpitations might occur several times per day. She has also been told that her resting heart rate is elevated and has had multiple ER visits where this is shown.  She has also had 3 syncopal episodes. The first of which was witnessed. She says at the time, she was standing and had a sudden sensation of flushing which was quickly followed by syncope. She fell backward and apparently regained consciousness within about 5 seconds. Upon regaining consciousness, she had significant nausea and also vomiting. This happened on 2 additional occasions and neither of those were witnessed. She was seen in the emergency department after one such occasion in early February. Workup was unrevealing and she was advised to follow-up with cardiology.  She denies any recent history of chest pain, PND, orthopnea, edema, or early satiety. She does sometimes awaken the  middle of the night gasping for air. This is rare. She is not sure if she snores as she sleeps alone, but thinks that she probably does.  Home Medications    Prior to Admission medications   Medication Sig Start Date End Date Taking? Authorizing Provider  amitriptyline (ELAVIL) 25 MG tablet Take 25 mg by mouth at bedtime.   Yes Historical Provider, MD  baclofen (LIORESAL) 10 MG tablet Take 10 mg by mouth 3 (three) times daily.   Yes Historical Provider, MD  Cholecalciferol (VITAMIN D3) 3000 units TABS  Take 5,000 Units by mouth daily.   Yes Historical Provider, MD  cyclobenzaprine (FLEXERIL) 10 MG tablet Take 10 mg by mouth 3 (three) times daily as needed for muscle spasms.   Yes Historical Provider, MD  DULoxetine (CYMBALTA) 30 MG capsule Take 30 mg by mouth 2 (two) times daily.   Yes Historical Provider, MD  gabapentin (NEURONTIN) 100 MG capsule Take 100 mg by mouth as directed. 03/24/16  Yes Historical Provider, MD  lansoprazole (PREVACID) 30 MG capsule Take 30 mg by mouth 2 (two) times daily before a meal.   Yes Historical Provider, MD  levothyroxine (SYNTHROID, LEVOTHROID) 100 MCG tablet Take 100 mcg by mouth daily before breakfast.   Yes Historical Provider, MD  loratadine (CLARITIN) 10 MG tablet Take 10 mg by mouth daily.   Yes Historical Provider, MD  meloxicam (MOBIC) 7.5 MG tablet Take 7.5 mg by mouth 2 (two) times daily as needed. 03/22/16  Yes Historical Provider, MD  traMADol (ULTRAM) 50 MG tablet Take 1-2 tablets by mouth 2 (two) times daily. 04/01/16  Yes Historical Provider, MD  traZODone (DESYREL) 50 MG tablet Take 1-2 tablets by mouth at bedtime. 03/24/16  Yes Historical Provider, MD    Family History    Family History  Problem Relation Age of Onset  . Atrial fibrillation Mother     Social History    Social History   Social History  . Marital status: Married    Spouse name: N/A  . Number of children: N/A  . Years of education: N/A   Occupational History  . Disabled    Social History Main Topics  . Smoking status: Current Every Day Smoker    Packs/day: 1.00    Years: 41.00  . Smokeless tobacco: Never Used     Comment: 41 pack year h/o tobacco abuse, currently ~ 0.5 ppd.  . Alcohol use No  . Drug use: Yes    Types: Marijuana     Comment: 2 marijuana cigarettes/wk.  Marland Kitchen Sexual activity: Not Currently   Other Topics Concern  . Not on file   Social History Narrative   Moved from [place] in summer of 2017.  Now lives in [place] by herself.  Does not routinely exercise.       Review of Systems    General:  No chills, fever, night sweats or weight changes.  Cardiovascular:  No chest pain, +++ chronic dyspnea on exertion, no edema, orthopnea, +++ palpitations, +++ occasional paroxysmal nocturnal dyspnea. +++ syncope x 3. Dermatological: No rash, lesions/masses Respiratory: No cough, +++ dyspnea Urologic: No hematuria, dysuria Abdominal:   +++ nausea, vomiting in setting of syncope. No diarrhea, bright red blood per rectum, melena, or hematemesis Neurologic:  No visual changes, wkns, changes in mental status. All other systems reviewed and are otherwise negative except as noted above.  Physical Exam    VS:  BP 137/80   Pulse (!) 124   Ht 5\' 7"  (1.702  m)   Wt 195 lb 9.6 oz (88.7 kg)   BMI 30.64 kg/m  , BMI Body mass index is 30.64 kg/m. GEN: Well nourished, well developed, in no acute distress.  HEENT: normal.  Neck: Supple, no JVD, carotid bruits, or masses. Cardiac: RRR, no murmurs, rubs, or gallops. No clubbing, cyanosis, edema.  Radials/DP/PT 2+ and equal bilaterally.  Respiratory:  Respirations regular and unlabored, clear to auscultation bilaterally. GI: Soft, nontender, nondistended, BS + x 4. MS: no deformity or atrophy. Skin: warm and dry, no rash. Neuro:  Strength and sensation are intact. Psych: Normal affect.  Accessory Clinical Findings    ECG - Sinus tachycardia, incomplete right bundle branch block, nonspecific ST changes.  Assessment & Plan   1.  Palpitations: Patient presents with a several month history of intermittent palpitations, occurring daily, described as fast heart beats, lasting about 5 minutes, and resolving spontaneously. At baseline, she is in sinus tachycardia. She currently has no symptoms at rest. Recent ER evaluation in the setting of syncope was notable for normal electrolytes, renal function, and hemoglobin/hematocrit. I will arrange for a 30 day event monitor. Though she has palpitations daily, because she has  had syncope 3 as well, I would like to be able to capture her heart rhythm if she were to have recurrent syncope. She did not have a TSH checked recently and I will obtain this. I will also obtain a d-dimer and history of prior DVT and ongoing sinus tachycardia.   2. Syncope: Patient has suffered 3 syncopal spells over the past few months. One was witnessed and she was apparently without consciousness for about 5 seconds. Prodromal symptoms included flushing. She expressed nausea and vomiting after the episode. Plan for monitoring as above. I will also arrange for a 2-D echocardiogram.  If monitoring unrevealing, we'll need to consider other causes of inappropriate sinus tachycardia and or syncope. She is not orthostatic and has a normal blood pressure at this point. Would need to consider tilt table testing.  3. Snoring: Patient believes she snores and also occasionally wakes up suddenly gasping for air. I will arrange for a sleep study.  4. Tobacco abuse: Currently smoking about half pack a day. Complete cessation advised. She's been smoking for 41 years.  5. Marijuana usage: Cessation advised.  6. Hyperlipidemia: Patient says she previously was on statin therapy for very high lipids. Since moving to [place], she has come off of statin. I will arrange for follow-up lipids and LFTs and likely plan on initiation of statin pending results.  7. Depression and anxiety: Followed by primary care.  8. Fibromyalgia: Followed by primary care.  9. Hypothyroidism: She is on Synthroid. Given palpitations, I will follow-up a TSH.  10. Disposition: Follow-up labs, echo, monitoring, and sleep study. I will see her back in one month or sooner if necessary.  Murray Hodgkins, NP 04/17/2016, 5:07 PM

## 2016-04-18 ENCOUNTER — Other Ambulatory Visit: Payer: Self-pay | Admitting: Nurse Practitioner

## 2016-04-18 ENCOUNTER — Encounter: Payer: Self-pay | Admitting: *Deleted

## 2016-04-18 DIAGNOSIS — R002 Palpitations: Secondary | ICD-10-CM

## 2016-04-18 DIAGNOSIS — Z13220 Encounter for screening for lipoid disorders: Secondary | ICD-10-CM | POA: Diagnosis not present

## 2016-04-18 DIAGNOSIS — R55 Syncope and collapse: Secondary | ICD-10-CM

## 2016-04-18 DIAGNOSIS — R000 Tachycardia, unspecified: Secondary | ICD-10-CM

## 2016-04-18 DIAGNOSIS — E039 Hypothyroidism, unspecified: Secondary | ICD-10-CM | POA: Diagnosis not present

## 2016-04-18 DIAGNOSIS — R42 Dizziness and giddiness: Secondary | ICD-10-CM | POA: Diagnosis not present

## 2016-04-18 NOTE — Progress Notes (Signed)
Patient ID: Alexandra Harvey, female   DOB: December 13, 1955, 61 y.o.   MRN: [id number] Patient did not show up for 04/18/16, 3:00 PM, appointment to have a cardiac event monitor applied.

## 2016-04-19 ENCOUNTER — Encounter: Payer: Self-pay | Admitting: Neurology

## 2016-04-29 NOTE — Progress Notes (Signed)
OK  Kalila Adkison, MD  

## 2016-05-02 DIAGNOSIS — E785 Hyperlipidemia, unspecified: Secondary | ICD-10-CM | POA: Diagnosis not present

## 2016-05-02 DIAGNOSIS — R197 Diarrhea, unspecified: Secondary | ICD-10-CM | POA: Diagnosis not present

## 2016-05-02 DIAGNOSIS — K2270 Barrett's esophagus without dysplasia: Secondary | ICD-10-CM | POA: Diagnosis not present

## 2016-05-03 ENCOUNTER — Telehealth: Payer: Self-pay | Admitting: Nurse Practitioner

## 2016-05-03 NOTE — Telephone Encounter (Signed)
Alexandra Harvey,  Patient called me back after several attempts of me calling her to let us know she is not going to get the monitor. I have removed the order from work que.   Davy Pique

## 2016-05-06 ENCOUNTER — Ambulatory Visit (HOSPITAL_COMMUNITY): Payer: Medicare Other | Attending: Cardiovascular Disease

## 2016-05-07 DIAGNOSIS — R1310 Dysphagia, unspecified: Secondary | ICD-10-CM | POA: Diagnosis not present

## 2016-05-07 DIAGNOSIS — K2270 Barrett's esophagus without dysplasia: Secondary | ICD-10-CM | POA: Diagnosis not present

## 2016-05-07 DIAGNOSIS — Z86010 Personal history of colonic polyps: Secondary | ICD-10-CM | POA: Diagnosis not present

## 2016-05-07 DIAGNOSIS — K219 Gastro-esophageal reflux disease without esophagitis: Secondary | ICD-10-CM | POA: Diagnosis not present

## 2016-05-15 DIAGNOSIS — Z79891 Long term (current) use of opiate analgesic: Secondary | ICD-10-CM | POA: Diagnosis not present

## 2016-05-15 DIAGNOSIS — G894 Chronic pain syndrome: Secondary | ICD-10-CM | POA: Diagnosis not present

## 2016-05-15 DIAGNOSIS — Z79899 Other long term (current) drug therapy: Secondary | ICD-10-CM | POA: Diagnosis not present

## 2016-05-15 DIAGNOSIS — M47812 Spondylosis without myelopathy or radiculopathy, cervical region: Secondary | ICD-10-CM | POA: Diagnosis not present

## 2016-05-15 DIAGNOSIS — M5137 Other intervertebral disc degeneration, lumbosacral region: Secondary | ICD-10-CM | POA: Diagnosis not present

## 2016-05-15 DIAGNOSIS — M797 Fibromyalgia: Secondary | ICD-10-CM | POA: Diagnosis not present

## 2016-05-23 ENCOUNTER — Ambulatory Visit: Payer: Medicare Other | Admitting: Cardiology

## 2016-05-24 ENCOUNTER — Encounter: Payer: Self-pay | Admitting: *Deleted

## 2016-05-24 DIAGNOSIS — K219 Gastro-esophageal reflux disease without esophagitis: Secondary | ICD-10-CM | POA: Diagnosis not present

## 2016-05-28 DIAGNOSIS — F339 Major depressive disorder, recurrent, unspecified: Secondary | ICD-10-CM | POA: Diagnosis not present

## 2016-05-28 DIAGNOSIS — F411 Generalized anxiety disorder: Secondary | ICD-10-CM | POA: Diagnosis not present

## 2016-05-28 DIAGNOSIS — F902 Attention-deficit hyperactivity disorder, combined type: Secondary | ICD-10-CM | POA: Diagnosis not present

## 2016-05-28 DIAGNOSIS — F410 Panic disorder [episodic paroxysmal anxiety] without agoraphobia: Secondary | ICD-10-CM | POA: Diagnosis not present

## 2016-06-03 ENCOUNTER — Encounter: Payer: Self-pay | Admitting: Nurse Practitioner

## 2016-06-03 ENCOUNTER — Ambulatory Visit (HOSPITAL_BASED_OUTPATIENT_CLINIC_OR_DEPARTMENT_OTHER): Payer: Medicare Other | Attending: Nurse Practitioner

## 2016-06-20 DIAGNOSIS — M542 Cervicalgia: Secondary | ICD-10-CM | POA: Diagnosis not present

## 2016-06-20 DIAGNOSIS — Z79899 Other long term (current) drug therapy: Secondary | ICD-10-CM | POA: Diagnosis not present

## 2016-06-20 DIAGNOSIS — M5137 Other intervertebral disc degeneration, lumbosacral region: Secondary | ICD-10-CM | POA: Diagnosis not present

## 2016-06-20 DIAGNOSIS — Z79891 Long term (current) use of opiate analgesic: Secondary | ICD-10-CM | POA: Diagnosis not present

## 2016-06-20 DIAGNOSIS — M47812 Spondylosis without myelopathy or radiculopathy, cervical region: Secondary | ICD-10-CM | POA: Diagnosis not present

## 2016-06-20 DIAGNOSIS — G894 Chronic pain syndrome: Secondary | ICD-10-CM | POA: Diagnosis not present

## 2016-07-09 DIAGNOSIS — F339 Major depressive disorder, recurrent, unspecified: Secondary | ICD-10-CM | POA: Diagnosis not present

## 2016-07-09 DIAGNOSIS — F411 Generalized anxiety disorder: Secondary | ICD-10-CM | POA: Diagnosis not present

## 2016-07-09 DIAGNOSIS — F410 Panic disorder [episodic paroxysmal anxiety] without agoraphobia: Secondary | ICD-10-CM | POA: Diagnosis not present

## 2016-07-09 DIAGNOSIS — F902 Attention-deficit hyperactivity disorder, combined type: Secondary | ICD-10-CM | POA: Diagnosis not present

## 2016-07-19 DIAGNOSIS — M797 Fibromyalgia: Secondary | ICD-10-CM | POA: Diagnosis not present

## 2016-07-19 DIAGNOSIS — G894 Chronic pain syndrome: Secondary | ICD-10-CM | POA: Diagnosis not present

## 2016-07-19 DIAGNOSIS — M5137 Other intervertebral disc degeneration, lumbosacral region: Secondary | ICD-10-CM | POA: Diagnosis not present

## 2016-07-19 DIAGNOSIS — M47812 Spondylosis without myelopathy or radiculopathy, cervical region: Secondary | ICD-10-CM | POA: Diagnosis not present

## 2016-07-30 DIAGNOSIS — F339 Major depressive disorder, recurrent, unspecified: Secondary | ICD-10-CM | POA: Diagnosis not present

## 2016-07-30 DIAGNOSIS — F411 Generalized anxiety disorder: Secondary | ICD-10-CM | POA: Diagnosis not present

## 2016-07-30 DIAGNOSIS — F410 Panic disorder [episodic paroxysmal anxiety] without agoraphobia: Secondary | ICD-10-CM | POA: Diagnosis not present

## 2016-07-30 DIAGNOSIS — F902 Attention-deficit hyperactivity disorder, combined type: Secondary | ICD-10-CM | POA: Diagnosis not present

## 2016-08-09 DIAGNOSIS — Z79899 Other long term (current) drug therapy: Secondary | ICD-10-CM | POA: Diagnosis not present

## 2016-08-09 DIAGNOSIS — F411 Generalized anxiety disorder: Secondary | ICD-10-CM | POA: Diagnosis not present

## 2016-09-24 DIAGNOSIS — F339 Major depressive disorder, recurrent, unspecified: Secondary | ICD-10-CM | POA: Diagnosis not present

## 2016-09-24 DIAGNOSIS — F411 Generalized anxiety disorder: Secondary | ICD-10-CM | POA: Diagnosis not present

## 2016-09-24 DIAGNOSIS — F410 Panic disorder [episodic paroxysmal anxiety] without agoraphobia: Secondary | ICD-10-CM | POA: Diagnosis not present

## 2016-09-24 DIAGNOSIS — F902 Attention-deficit hyperactivity disorder, combined type: Secondary | ICD-10-CM | POA: Diagnosis not present

## 2016-10-22 DIAGNOSIS — F902 Attention-deficit hyperactivity disorder, combined type: Secondary | ICD-10-CM | POA: Diagnosis not present

## 2016-10-22 DIAGNOSIS — F411 Generalized anxiety disorder: Secondary | ICD-10-CM | POA: Diagnosis not present

## 2016-10-22 DIAGNOSIS — F339 Major depressive disorder, recurrent, unspecified: Secondary | ICD-10-CM | POA: Diagnosis not present

## 2016-10-22 DIAGNOSIS — F410 Panic disorder [episodic paroxysmal anxiety] without agoraphobia: Secondary | ICD-10-CM | POA: Diagnosis not present

## 2016-11-18 DIAGNOSIS — L03019 Cellulitis of unspecified finger: Secondary | ICD-10-CM | POA: Diagnosis not present

## 2016-11-18 DIAGNOSIS — F419 Anxiety disorder, unspecified: Secondary | ICD-10-CM | POA: Diagnosis not present

## 2016-12-18 DIAGNOSIS — R399 Unspecified symptoms and signs involving the genitourinary system: Secondary | ICD-10-CM | POA: Diagnosis not present

## 2016-12-18 DIAGNOSIS — A6004 Herpesviral vulvovaginitis: Secondary | ICD-10-CM | POA: Diagnosis not present

## 2016-12-18 DIAGNOSIS — M25552 Pain in left hip: Secondary | ICD-10-CM | POA: Diagnosis not present

## 2016-12-18 DIAGNOSIS — Z23 Encounter for immunization: Secondary | ICD-10-CM | POA: Diagnosis not present

## 2016-12-23 ENCOUNTER — Other Ambulatory Visit: Payer: Self-pay | Admitting: Family Medicine

## 2016-12-23 ENCOUNTER — Ambulatory Visit
Admission: RE | Admit: 2016-12-23 | Discharge: 2016-12-23 | Disposition: A | Discharge status: Home / Self Care | Payer: Medicare Other | Source: Ambulatory Visit | Attending: Family Medicine | Admitting: Family Medicine

## 2016-12-23 DIAGNOSIS — M25552 Pain in left hip: Secondary | ICD-10-CM

## 2016-12-23 DIAGNOSIS — M1612 Unilateral primary osteoarthritis, left hip: Secondary | ICD-10-CM | POA: Diagnosis not present

## 2017-03-18 DIAGNOSIS — F411 Generalized anxiety disorder: Secondary | ICD-10-CM | POA: Diagnosis not present

## 2017-03-18 DIAGNOSIS — F339 Major depressive disorder, recurrent, unspecified: Secondary | ICD-10-CM | POA: Diagnosis not present

## 2017-03-18 DIAGNOSIS — F902 Attention-deficit hyperactivity disorder, combined type: Secondary | ICD-10-CM | POA: Diagnosis not present

## 2017-03-18 DIAGNOSIS — F410 Panic disorder [episodic paroxysmal anxiety] without agoraphobia: Secondary | ICD-10-CM | POA: Diagnosis not present

## 2017-03-20 ENCOUNTER — Encounter (HOSPITAL_COMMUNITY): Payer: Self-pay | Admitting: Emergency Medicine

## 2017-03-20 ENCOUNTER — Emergency Department (HOSPITAL_COMMUNITY)
Admission: EM | Admit: 2017-03-20 | Discharge: 2017-03-20 | Disposition: A | Discharge status: Home / Self Care | Payer: Medicare Other | Attending: Emergency Medicine | Admitting: Emergency Medicine

## 2017-03-20 ENCOUNTER — Emergency Department (HOSPITAL_COMMUNITY): Payer: Medicare Other

## 2017-03-20 ENCOUNTER — Other Ambulatory Visit: Payer: Self-pay

## 2017-03-20 DIAGNOSIS — K130 Diseases of lips: Secondary | ICD-10-CM | POA: Insufficient documentation

## 2017-03-20 DIAGNOSIS — R220 Localized swelling, mass and lump, head: Secondary | ICD-10-CM | POA: Diagnosis present

## 2017-03-20 DIAGNOSIS — L03222 Acute lymphangitis of neck: Secondary | ICD-10-CM | POA: Insufficient documentation

## 2017-03-20 DIAGNOSIS — Z79899 Other long term (current) drug therapy: Secondary | ICD-10-CM | POA: Diagnosis not present

## 2017-03-20 DIAGNOSIS — M542 Cervicalgia: Secondary | ICD-10-CM | POA: Diagnosis not present

## 2017-03-20 DIAGNOSIS — R221 Localized swelling, mass and lump, neck: Secondary | ICD-10-CM | POA: Diagnosis not present

## 2017-03-20 DIAGNOSIS — E039 Hypothyroidism, unspecified: Secondary | ICD-10-CM | POA: Diagnosis not present

## 2017-03-20 DIAGNOSIS — F17210 Nicotine dependence, cigarettes, uncomplicated: Secondary | ICD-10-CM | POA: Insufficient documentation

## 2017-03-20 DIAGNOSIS — L0291 Cutaneous abscess, unspecified: Secondary | ICD-10-CM

## 2017-03-20 LAB — COMPREHENSIVE METABOLIC PANEL
ALK PHOS: 56 U/L (ref 38–126)
ALT: 20 U/L (ref 14–54)
AST: 22 U/L (ref 15–41)
Albumin: 4.1 g/dL (ref 3.5–5.0)
Anion gap: 11 (ref 5–15)
BILIRUBIN TOTAL: 0.6 mg/dL (ref 0.3–1.2)
CO2: 27 mmol/L (ref 22–32)
CREATININE: 0.72 mg/dL (ref 0.44–1.00)
Calcium: 9.1 mg/dL (ref 8.9–10.3)
Chloride: 106 mmol/L (ref 101–111)
GFR calc Af Amer: >60 mL/min (ref >60)
GLUCOSE: 82 mg/dL (ref 65–99)
Potassium: 4.5 mmol/L (ref 3.5–5.1)
Sodium: 144 mmol/L (ref 135–145)
TOTAL PROTEIN: 7.2 g/dL (ref 6.5–8.1)

## 2017-03-20 LAB — CBC WITH DIFFERENTIAL/PLATELET
BASOS ABS: 0 10*3/uL (ref 0.0–0.1)
Basophils Relative: 1 %
EOS ABS: 0.3 10*3/uL (ref 0.0–0.7)
EOS PCT: 4 %
HCT: 39.9 % (ref 36.0–46.0)
Hemoglobin: 13.1 g/dL (ref 12.0–15.0)
LYMPHS ABS: 2.4 10*3/uL (ref 0.7–4.0)
Lymphocytes Relative: 35 %
MCH: 30.5 pg (ref 26.0–34.0)
MCHC: 32.8 g/dL (ref 30.0–36.0)
MCV: 93 fL (ref 78.0–100.0)
MONO ABS: 0.4 10*3/uL (ref 0.1–1.0)
Monocytes Relative: 5 %
Neutro Abs: 3.8 10*3/uL (ref 1.7–7.7)
Neutrophils Relative %: 55 %
PLATELETS: 279 10*3/uL (ref 150–400)
RBC: 4.29 MIL/uL (ref 3.87–5.11)
RDW: 13.6 % (ref 11.5–15.5)
WBC: 7 10*3/uL (ref 4.0–10.5)

## 2017-03-20 LAB — I-STAT CG4 LACTIC ACID, ED: Lactic Acid, Venous: 1.76 mmol/L (ref 0.5–1.9)

## 2017-03-20 MED ORDER — IOPAMIDOL (ISOVUE-300) INJECTION 61%
INTRAVENOUS | Status: AC
  Administered 2017-03-20: 75 mL
  Filled 2017-03-20: qty 75

## 2017-03-20 MED ORDER — LIDOCAINE HCL (PF) 1 % IJ SOLN
5.0000 mL | Freq: Once | INTRAMUSCULAR | Status: AC
  Administered 2017-03-20: 5 mL via INTRADERMAL
  Filled 2017-03-20: qty 5

## 2017-03-20 MED ORDER — CEPHALEXIN 500 MG PO CAPS: 500.0000 mg | ORAL_CAPSULE | Freq: Three times a day (TID) | ORAL | 0 refills | Status: DC

## 2017-03-20 MED ORDER — LIDOCAINE-EPINEPHRINE-TETRACAINE (LET) SOLUTION: 3.0000 mL | Freq: Once | NASAL | Status: DC

## 2017-03-20 NOTE — ED Notes (Signed)
Lab work, radiology results and vital signs reviewed, no critical results at this time, no change in acuity indicated. Awaiting CT result.

## 2017-03-20 NOTE — ED Triage Notes (Signed)
Pt c/o abscess to lower lip x 5 days, swelling noted to lip and throat.  Pt reports numbness and tingling down LUE.   Pt drinking beverage in triage, able to maintain secretions.

## 2017-03-20 NOTE — Discharge Instructions (Signed)
Follow-up with your family doctor next week for recheck.  Take Tylenol or Motrin for pain.  Return to the emergency department if getting worse

## 2017-03-20 NOTE — ED Provider Notes (Signed)
[facility] EMERGENCY DEPARTMENT Provider Note   CSN: [id number] Arrival date & time: 03/20/17  1108     History   Chief Complaint Chief Complaint  Patient presents with  . Abscess  . Oral Swelling    HPI Alexandra Harvey is a 62 y.o. female.  Patient complains of swelling to her lower lip.  She also has pain in her neck   The history is provided by the patient.  Abscess  Location:  Head/neck Size:  1 cm Abscess quality: not draining   Red streaking: yes   Progression:  Worsening Chronicity:  New Context: not diabetes   Relieved by:  Nothing Worsened by:  Nothing Associated symptoms: no fatigue and no headaches     Past Medical History:  Diagnosis Date  . Anxiety   . Chest pain    a. [place] 3 nl stress tests in [place] over past 10 years.  . Depression   . DVT (deep venous thrombosis) ([facility])    a. 2016 R DVT after knee surgery  tx w/ coumadin x 2 mos.  . Fibromyalgia   . GERD (gastroesophageal reflux disease)   . Hyperlipidemia   . Hypothyroidism   . Seasonal allergies   . Snoring    a. sometimes awakes @ night gasping.    There are no active problems to display for this patient.   Past Surgical History:  Procedure Laterality Date  . CARPAL TUNNEL RELEASE Right   . CERVICAL SPINE SURGERY    . CHOLECYSTECTOMY    . ELBOW SURGERY Right   . FOOT SURGERY Right   . KNEE ARTHROSCOPY Bilateral   . SHOULDER SURGERY Right   . TOTAL ABDOMINAL HYSTERECTOMY    . TOTAL KNEE ARTHROPLASTY Right     OB History    No data available       Home Medications    Prior to Admission medications   Medication Sig Start Date End Date Taking? Authorizing Provider  amitriptyline (ELAVIL) 25 MG tablet Take 25 mg by mouth at bedtime.    [provider]  baclofen (LIORESAL) 10 MG tablet Take 10 mg by mouth 3 (three) times daily.    [provider]  cephALEXin (KEFLEX) 500 MG capsule Take 1 capsule (500 mg total) by mouth 3 (three) times  daily. 03/20/17   Milton Ferguson, MD  Cholecalciferol (VITAMIN D3) 3000 units TABS Take 5,000 Units by mouth daily.    [provider]  cyclobenzaprine (FLEXERIL) 10 MG tablet Take 10 mg by mouth 3 (three) times daily as needed for muscle spasms.    [provider]  DULoxetine (CYMBALTA) 30 MG capsule Take 30 mg by mouth 2 (two) times daily.    [provider]  gabapentin (NEURONTIN) 100 MG capsule Take 100 mg by mouth as directed. 03/24/16   [provider]  lansoprazole (PREVACID) 30 MG capsule Take 30 mg by mouth 2 (two) times daily before a meal.    [provider]  levothyroxine (SYNTHROID, LEVOTHROID) 100 MCG tablet Take 100 mcg by mouth daily before breakfast.    [provider]  loratadine (CLARITIN) 10 MG tablet Take 10 mg by mouth daily.    [provider]  meloxicam (MOBIC) 7.5 MG tablet Take 7.5 mg by mouth 2 (two) times daily as needed. 03/22/16   [provider]  traMADol (ULTRAM) 50 MG tablet Take 1-2 tablets by mouth 2 (two) times daily. 04/01/16   [provider]  traZODone (DESYREL) 50 MG  tablet Take 1-2 tablets by mouth at bedtime. 03/24/16   [provider]    Family History Family History  Problem Relation Age of Onset  . Atrial fibrillation Mother     Social History Social History   Tobacco Use  . Smoking status: Current Every Day Smoker    Packs/day: 0.50    Years: 41.00    Pack years: 20.50  . Smokeless tobacco: Never Used  . Tobacco comment: 41 pack year h/o tobacco abuse, currently ~ 0.5 ppd.  Substance Use Topics  . Alcohol use: No  . Drug use: Yes    Types: Marijuana    Comment: 2 marijuana cigarettes/wk.     Allergies   Tetracyclines & related   Review of Systems Review of Systems  Constitutional: Negative for appetite change and fatigue.  HENT: Negative for congestion, ear discharge and sinus pressure.        Swelling to lower lip  Eyes: Negative for  discharge.  Respiratory: Negative for cough.   Cardiovascular: Negative for chest pain.  Gastrointestinal: Negative for abdominal pain and diarrhea.  Genitourinary: Negative for frequency and hematuria.  Musculoskeletal: Negative for back pain.  Skin: Negative for rash.  Neurological: Negative for seizures and headaches.  Psychiatric/Behavioral: Negative for hallucinations.     Physical Exam Updated Vital Signs BP (!) 128/99 (BP Location: Left Arm)   Pulse 66   Temp 98.3 F (36.8 C) (Oral)   Resp 17   Ht 5\' 7"  (1.702 m)   Wt 77.1 kg (170 lb)   SpO2 99%   BMI 26.63 kg/m   Physical Exam  Constitutional: She is oriented to person, place, and time. She appears well-developed.  HENT:  Head: Normocephalic.  Patient has significant swelling to lower lip that appears to be about a 1 cm abscess.  She also has tenderness to anterior left neck lymph nodes  Eyes: Conjunctivae and EOM are normal. No scleral icterus.  Neck: Neck supple. No thyromegaly present.  Cardiovascular: Normal rate and regular rhythm. Exam reveals no gallop and no friction rub.  No murmur heard. Pulmonary/Chest: No stridor. She has no wheezes. She has no rales. She exhibits no tenderness.  Abdominal: She exhibits no distension. There is no tenderness. There is no rebound.  Musculoskeletal: Normal range of motion. She exhibits no edema.  Lymphadenopathy:    She has no cervical adenopathy.  Neurological: She is oriented to person, place, and time. She exhibits normal muscle tone. Coordination normal.  Skin: No rash noted. No erythema.  Psychiatric: She has a normal mood and affect. Her behavior is normal.     ED Treatments / Results  Labs (all labs ordered are listed, but only abnormal results are displayed) Labs Reviewed  COMPREHENSIVE METABOLIC PANEL - Abnormal; Notable for the following components:      Result Value   BUN <5 (*)    All other components within normal limits  CBC WITH  DIFFERENTIAL/PLATELET  I-STAT CG4 LACTIC ACID, ED    EKG  EKG Interpretation None       Radiology Ct Soft Tissue Neck W Contrast  Result Date: 03/20/2017 CLINICAL DATA:  Soft tissue swelling left lower lip EXAM: CT NECK WITH CONTRAST TECHNIQUE: Multidetector CT imaging of the neck was performed using the standard protocol following the bolus administration of intravenous contrast. CONTRAST:  69mL ISOVUE-300 IOPAMIDOL (ISOVUE-300) INJECTION 61% COMPARISON:  None. FINDINGS: Pharynx and larynx: Normal. No mass or swelling. Salivary glands: No inflammation, mass, or stone. Thyroid: Negative Lymph nodes:  Left submandibular lymph node 11 mm with mild surrounding edema suggesting acute inflammation. Right level 1 B lymph node 9 mm. Left level 3 lymph node 9 mm. Vascular: Negative Limited intracranial: Negative Visualized orbits: Negative Mastoids and visualized paranasal sinuses: Mild mucosal edema paranasal sinuses. Mastoid sinuses clear. Skeleton: ACDF C5-6 and C6-7 without acute abnormality. No bone lesion. Upper chest: Mild patchy airspace disease in the upper lobes bilaterally Other: Focal soft tissue swelling left lower lip with fairly homogeneous enhancement. No well-defined abscess is identified. IMPRESSION: Soft tissue swelling left lower lip most likely due to acute infection. Trauma could also have a similar appearance. Tumor considered less likely. Mildly enlarged left submandibular lymph node with surrounding edema likely due to infection. Mild patchy airspace disease in the upper lobes could be acute or chronic inflammation. Electronically Signed   By: Franchot Gallo M.D.   On: 03/20/2017 14:32    Procedures .Marland KitchenIncision and Drainage Date/Time: 03/20/2017 5:26 PM Performed by: Milton Ferguson, MD Authorized by: Milton Ferguson, MD   Consent:    Consent obtained:  Verbal   Consent given by:  Patient   Risks discussed:  Bleeding   Alternatives discussed:  No treatment Location:    Type:   Abscess   Size:  1 cm   Location: Lower lip. Pre-procedure details:    Skin preparation:  Antiseptic wash Anesthesia (see MAR for exact dosages):    Anesthesia method:  Local infiltration   Local anesthetic:  Lidocaine 1% w/o epi Procedure type:    Complexity:  Simple Procedure details:    Incision types:  Stab incision   Scalpel blade:  11   Packing materials:  None Comments:     Patient 1 cm abscess to lower lip.  The area was cleaned thoroughly with sterile water and 1% lidocaine was used to anesthetize the area.  A #11 blade was used to open the abscess and pus was removed.  The patient tolerated procedure well   (including critical care time)  Medications Ordered in ED Medications  lidocaine (PF) (XYLOCAINE) 1 % injection 5 mL (not administered)  iopamidol (ISOVUE-300) 61 % injection (75 mLs  Contrast Given 03/20/17 1358)     Initial Impression / Assessment and Plan / ED Course  I have reviewed the triage vital signs and the nursing notes.  Pertinent labs & imaging results that were available during my care of the patient were reviewed by me and considered in my medical decision making (see chart for details).     Patient with an abscess to her lower lip she sent home on Keflex and will follow up with her PCP  Final Clinical Impressions(s) / ED Diagnoses   Final diagnoses:  Abscess    ED Discharge Orders        Ordered    cephALEXin (KEFLEX) 500 MG capsule  3 times daily     03/20/17 1723       Milton Ferguson, MD 03/20/17 1727

## 2017-03-20 NOTE — ED Provider Notes (Signed)
Patient placed in Quick Look pathway, seen and evaluated   Chief Complaint: lip swelling and abscesss  HPI:   Reports worsening asbscess of lower lip x 5 days and left neck swelling  ROS: no fever (one)  Physical Exam:   Gen: No distress  Neuro: Awake and Alert  Skin: Warm    Focused Exam: abscess of lower lip, tolerating secretions. Anterior neck is normal. No erythema or crepitus   Initiation of care has begun. The patient has been counseled on the process, plan, and necessity for staying for the completion/evaluation, and the remainder of the medical screening examination    Jola Schmidt, MD 03/20/17 1348

## 2017-05-23 DIAGNOSIS — E039 Hypothyroidism, unspecified: Secondary | ICD-10-CM | POA: Diagnosis not present

## 2017-05-23 DIAGNOSIS — S99921A Unspecified injury of right foot, initial encounter: Secondary | ICD-10-CM | POA: Diagnosis not present

## 2017-05-23 DIAGNOSIS — S92501A Displaced unspecified fracture of right lesser toe(s), initial encounter for closed fracture: Secondary | ICD-10-CM | POA: Diagnosis not present

## 2017-05-23 DIAGNOSIS — A6004 Herpesviral vulvovaginitis: Secondary | ICD-10-CM | POA: Diagnosis not present

## 2017-05-23 DIAGNOSIS — M5136 Other intervertebral disc degeneration, lumbar region: Secondary | ICD-10-CM | POA: Diagnosis not present

## 2017-05-23 DIAGNOSIS — M797 Fibromyalgia: Secondary | ICD-10-CM | POA: Diagnosis not present

## 2017-05-23 DIAGNOSIS — E785 Hyperlipidemia, unspecified: Secondary | ICD-10-CM | POA: Diagnosis not present

## 2017-05-23 DIAGNOSIS — G894 Chronic pain syndrome: Secondary | ICD-10-CM | POA: Diagnosis not present

## 2017-05-23 DIAGNOSIS — F411 Generalized anxiety disorder: Secondary | ICD-10-CM | POA: Diagnosis not present

## 2017-06-03 IMAGING — MR MR CERVICAL SPINE W/O CM
5 series · 29 of 48 positions shown · non-contrast
Comparison: Cervical spine CT 07/13/2013

CLINICAL DATA: Neck pain. Pain extends into the bilateral upper
extremity.

EXAM:
MRI CERVICAL SPINE WITHOUT CONTRAST
TECHNIQUE: Multiplanar, multisequence MR imaging of the cervical spine was
performed. No intravenous contrast was administered.

[Series 3: T2 · sagittal · 3.0mm · 0.66mm/px · 6 of 12 slices shown (1 of 2)]
[im 1/12]
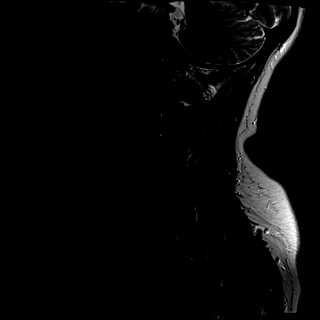
[im 3/12]
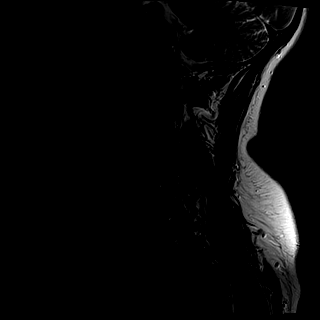
[im 5/12]
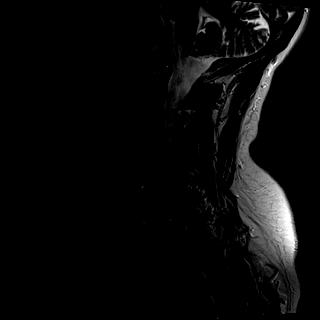
[im 7/12]
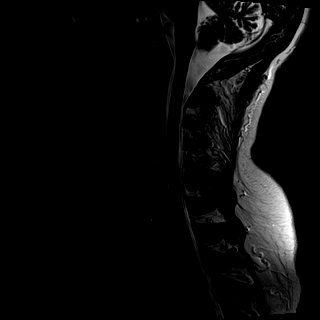
[im 9/12]
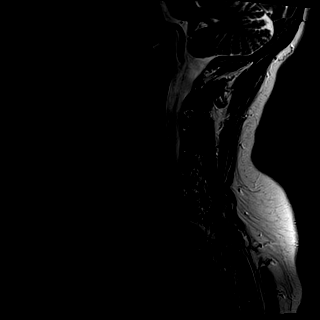
[im 12/12]
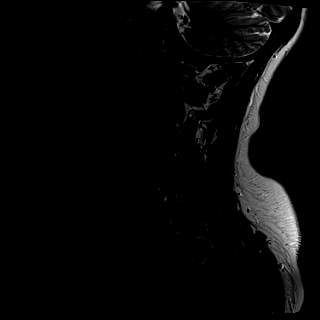

[Series 4: T1 · sagittal · 3.0mm · 0.41mm/px · 6 of 12 slices shown]
[im 1/12]
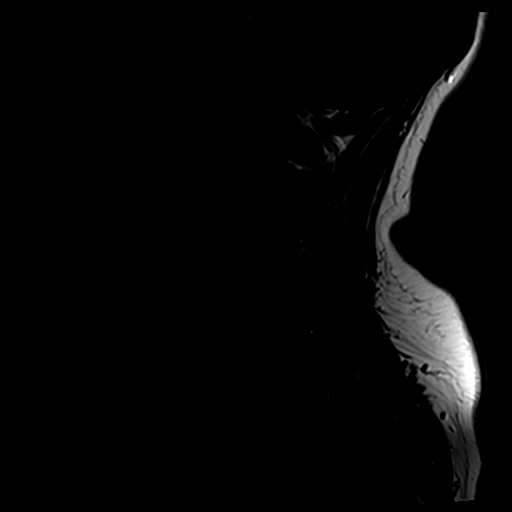
[im 3/12]
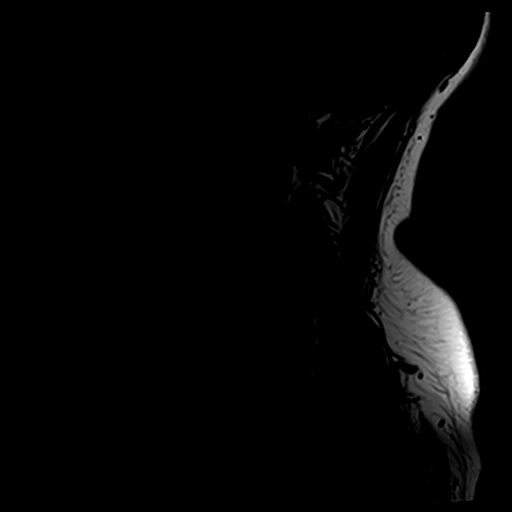
[im 5/12]
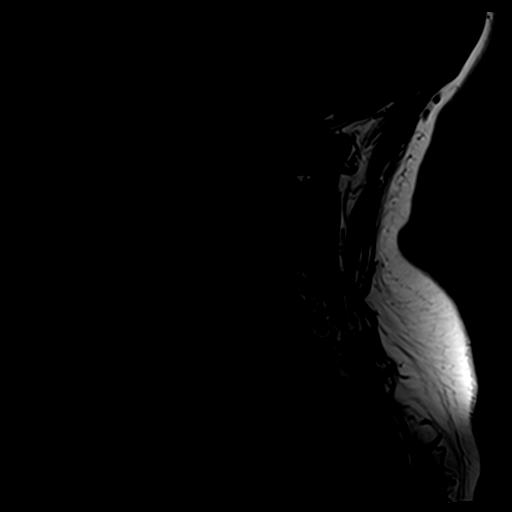
[im 7/12]
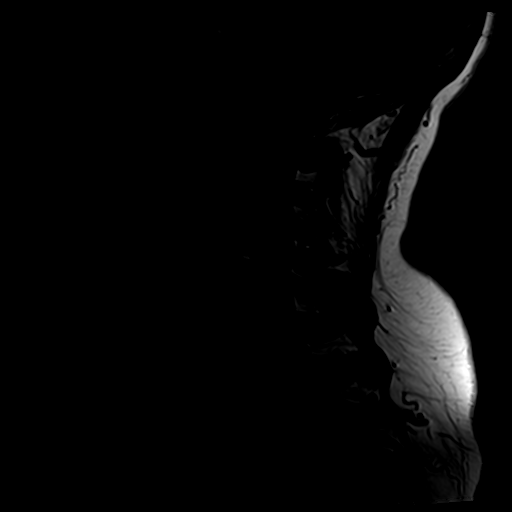
[im 9/12]
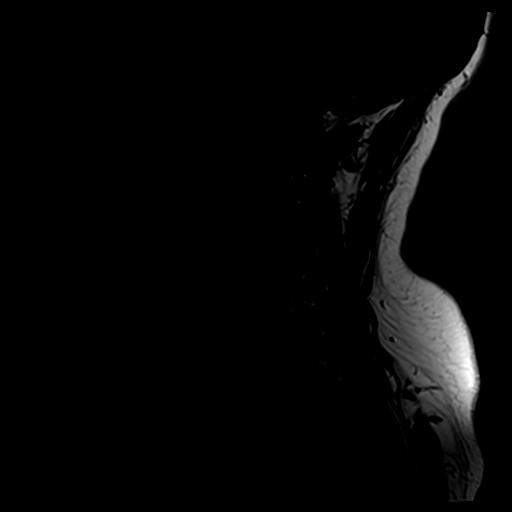
[im 12/12]
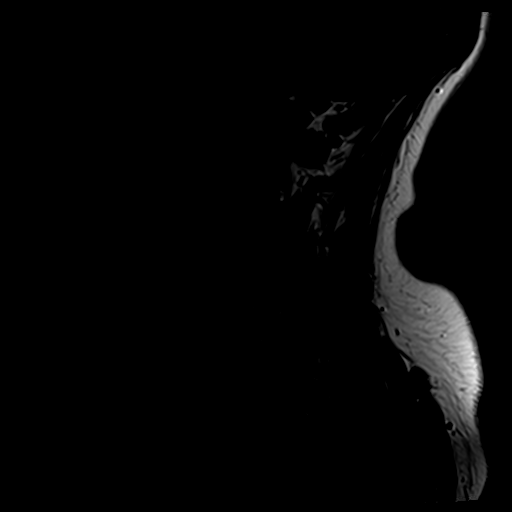

[Series 5: tir sag · sagittal · 3.0mm · 0.41mm/px · 6 of 12 slices shown]
[im 1/12]
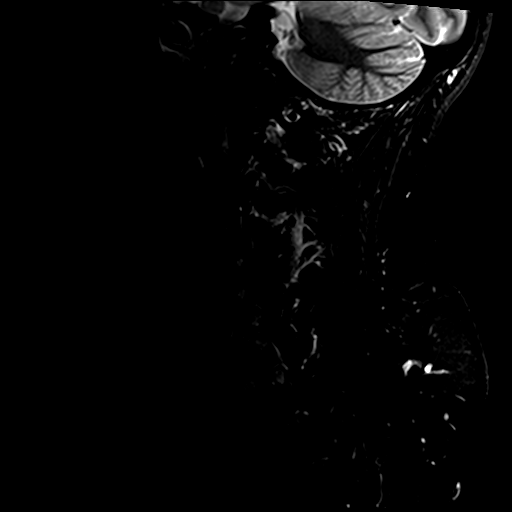
[im 3/12]
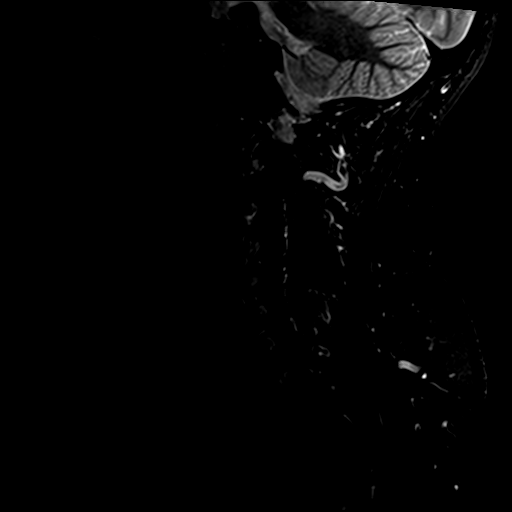
[im 5/12]
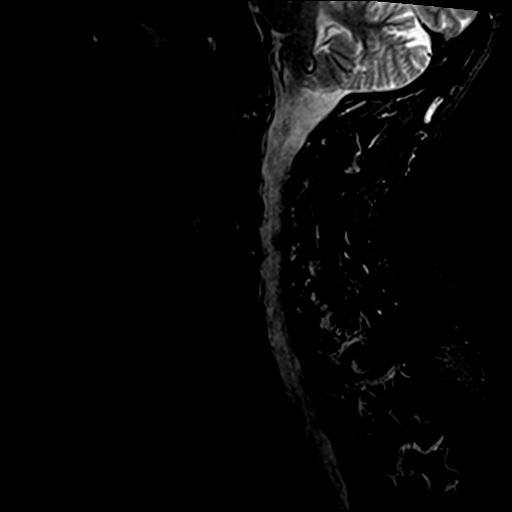
[im 7/12]
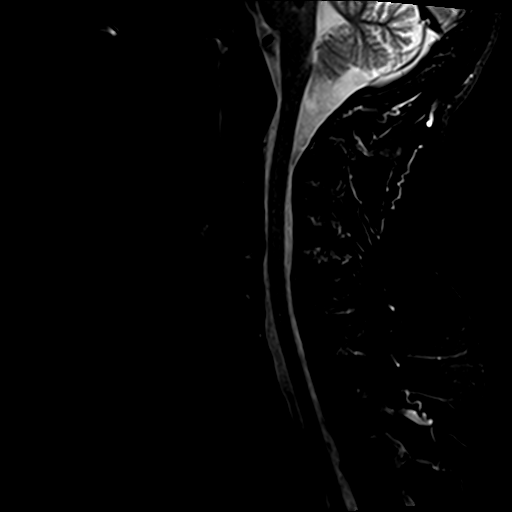
[im 9/12]
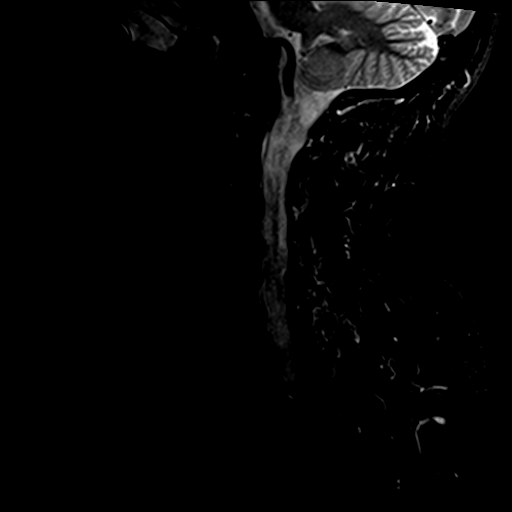
[im 12/12]
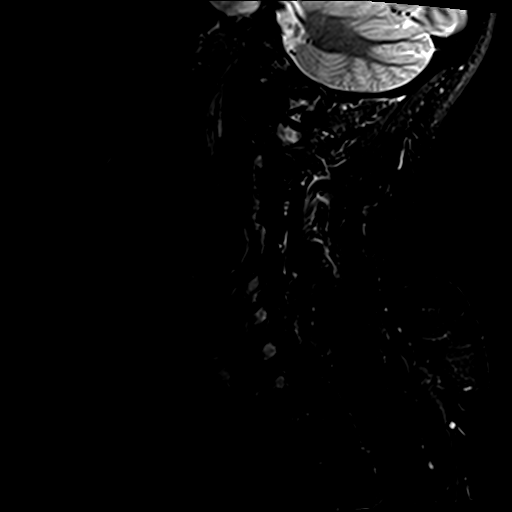

[Series 6: GRE · axial · 3.0mm · 0.35mm/px · z∈[-55,-40]mm · 2 of 32 slices shown]
[im 1/32]
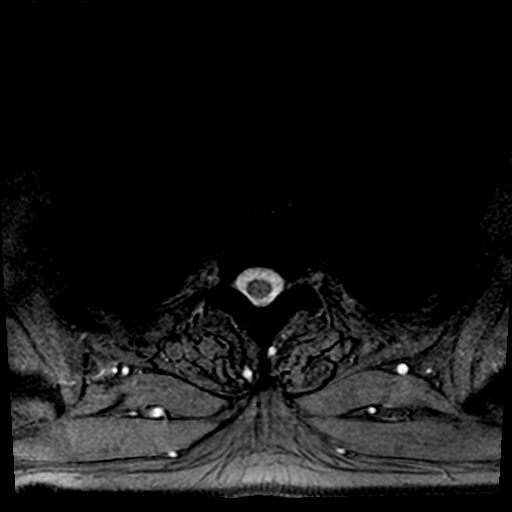
[im 5/32]
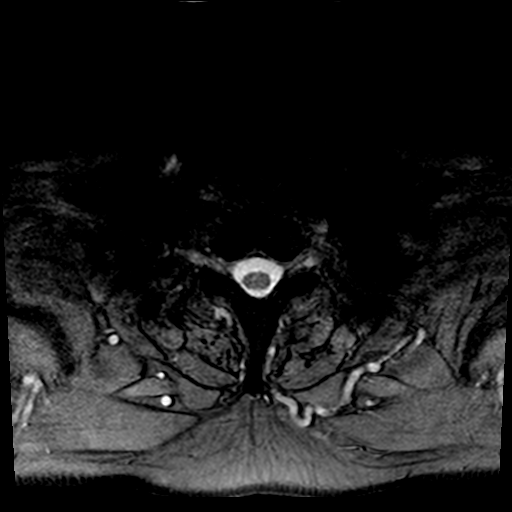

[Series 7: T2 · axial · 3.0mm · 0.70mm/px · z∈[-55,+59]mm · 9 of 32 slices shown (2 of 2)]
[im 1/32]
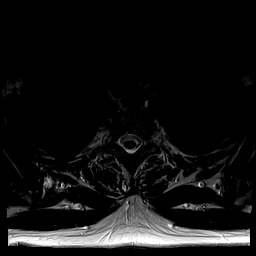
[im 5/32]
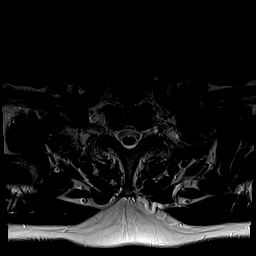
[im 9/32]
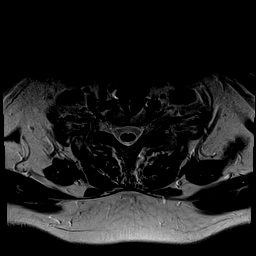
[im 14/32]
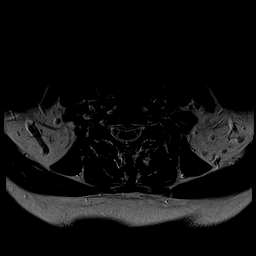
[im 16/32]
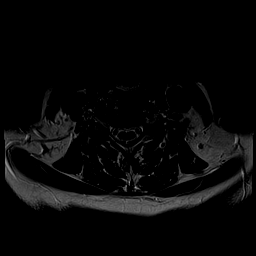
[im 18/32]
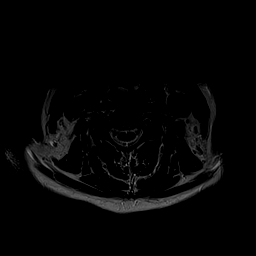
[im 23/32]
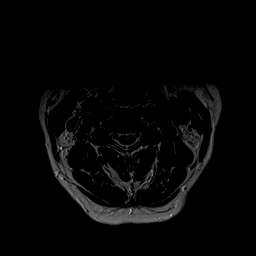
[im 27/32]
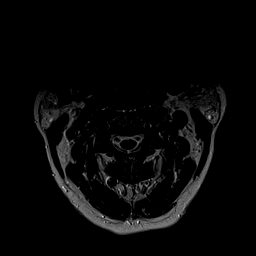
[im 32/32]
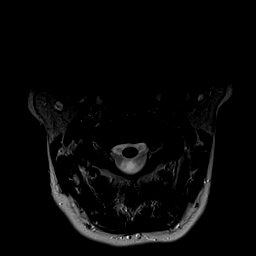

[29 of 48 positions shown; findings below may reference images not displayed]

FINDINGS: Alignment: Mild C2-3 and C3-4 anterolisthesis associated with facet
arthropathy. Borderline C3-4 anterolisthesis.

Vertebrae: No fracture, evidence of discitis, or bone lesion.
Postoperative changes described below

Cord: Normal signal and morphology.

Posterior Fossa, vertebral arteries, paraspinal tissues: Negative

Disc levels:

C2-3: Mild facet spurring.  No herniation or impingement

C3-4: Facet spurring and mild uncovertebral ridging. Borderline
anterolisthesis. Patent canal and foramina

C4-5: Facet spurring on the left and mild uncovertebral ridging. No
disc herniation. No impingement

C5-6: ACDF with solid bony fusion by CT in 6845. No residual
impingement.

C6-7: ACDF with solid bony fusion by prior CT. Residual left
uncovertebral ridging without high-grade stenosis.

C7-T1:Mild facet spurring.  No impingement
IMPRESSION: 1. No impingement throughout the cervical spine.
2. C5-6 and C6-7 ACDF with solid fusion by 6845 CT. No progressed or
advanced adjacent segment disease.
3. Facet spurring in the left more than right cervical spine and in
the upper thoracic spine with slight T2-3 and T3-4 anterolisthesis.

## 2017-07-24 DIAGNOSIS — H109 Unspecified conjunctivitis: Secondary | ICD-10-CM | POA: Diagnosis not present

## 2017-09-30 DIAGNOSIS — J0100 Acute maxillary sinusitis, unspecified: Secondary | ICD-10-CM | POA: Diagnosis not present

## 2017-09-30 DIAGNOSIS — J209 Acute bronchitis, unspecified: Secondary | ICD-10-CM | POA: Diagnosis not present

## 2017-10-06 ENCOUNTER — Emergency Department (HOSPITAL_COMMUNITY): Payer: Medicare Other

## 2017-10-06 ENCOUNTER — Emergency Department (HOSPITAL_COMMUNITY)
Admission: EM | Admit: 2017-10-06 | Discharge: 2017-10-06 | Disposition: A | Discharge status: Home / Self Care | Payer: Medicare Other | Attending: Emergency Medicine | Admitting: Emergency Medicine

## 2017-10-06 ENCOUNTER — Encounter (HOSPITAL_COMMUNITY): Payer: Self-pay

## 2017-10-06 DIAGNOSIS — R0981 Nasal congestion: Secondary | ICD-10-CM | POA: Diagnosis not present

## 2017-10-06 DIAGNOSIS — E785 Hyperlipidemia, unspecified: Secondary | ICD-10-CM | POA: Diagnosis not present

## 2017-10-06 DIAGNOSIS — F17200 Nicotine dependence, unspecified, uncomplicated: Secondary | ICD-10-CM | POA: Diagnosis not present

## 2017-10-06 DIAGNOSIS — Z79899 Other long term (current) drug therapy: Secondary | ICD-10-CM | POA: Diagnosis not present

## 2017-10-06 DIAGNOSIS — Z86718 Personal history of other venous thrombosis and embolism: Secondary | ICD-10-CM | POA: Diagnosis not present

## 2017-10-06 DIAGNOSIS — E039 Hypothyroidism, unspecified: Secondary | ICD-10-CM | POA: Diagnosis not present

## 2017-10-06 DIAGNOSIS — R05 Cough: Secondary | ICD-10-CM | POA: Diagnosis not present

## 2017-10-06 MED ORDER — PREDNISONE 20 MG PO TABS
60.0000 mg | ORAL_TABLET | Freq: Once | ORAL | Status: AC
  Administered 2017-10-06: 60 mg via ORAL
  Filled 2017-10-06: qty 3

## 2017-10-06 MED ORDER — ALBUTEROL SULFATE HFA 108 (90 BASE) MCG/ACT IN AERS
2.0000 | INHALATION_SPRAY | Freq: Once | RESPIRATORY_TRACT | Status: AC
  Administered 2017-10-06: 2 via RESPIRATORY_TRACT
  Filled 2017-10-06: qty 6.7

## 2017-10-06 MED ORDER — PREDNISONE 10 MG (21) PO TBPK: ORAL_TABLET | ORAL | 0 refills | Status: DC

## 2017-10-06 MED ORDER — IPRATROPIUM-ALBUTEROL 0.5-2.5 (3) MG/3ML IN SOLN
3.0000 mL | Freq: Once | RESPIRATORY_TRACT | Status: AC
  Administered 2017-10-06: 3 mL via RESPIRATORY_TRACT
  Filled 2017-10-06: qty 3

## 2017-10-06 NOTE — ED Provider Notes (Signed)
[facility] DEPT Provider Note   CSN: [id number] Arrival date & time: 10/06/17  1348     History   Chief Complaint Chief Complaint  Patient presents with  . Cough  . Nasal Congestion    HPI Alexandra Harvey is a 62 y.o. female.  HPI   Alexandra Benecke is a 62 y.o. female, with a history of DVT and hyperlipidemia, presenting to the ED with cough, nasal congestion, and sinus pressure for the last couple weeks.  She was seen by her PCP 5 days ago and prescribed 10 days of Augmentin; voices compliance. She arrives today because her symptoms have not resolved.  Denies chest pain, shortness of breath, dizziness, N/V/D, fever, or any other complaints.      Past Medical History:  Diagnosis Date  . Anxiety   . Chest pain    a. reports 3 nl stress tests in [place] over past 10 years.  . Depression   . DVT (deep venous thrombosis) ([place])    a. 2016 R DVT after knee surgery  tx w/ coumadin x 2 mos.  . Fibromyalgia   . GERD (gastroesophageal reflux disease)   . Hyperlipidemia   . Hypothyroidism   . Seasonal allergies   . Snoring    a. sometimes awakes @ night gasping.    There are no active problems to display for this patient.   Past Surgical History:  Procedure Laterality Date  . CARPAL TUNNEL RELEASE Right   . CERVICAL SPINE SURGERY    . CHOLECYSTECTOMY    . ELBOW SURGERY Right   . FOOT SURGERY Right   . KNEE ARTHROSCOPY Bilateral   . SHOULDER SURGERY Right   . TOTAL ABDOMINAL HYSTERECTOMY    . TOTAL KNEE ARTHROPLASTY Right      OB History   None      Home Medications    Prior to Admission medications   Medication Sig Start Date End Date Taking? Authorizing Provider  baclofen (LIORESAL) 10 MG tablet Take 20 mg by mouth 2 (two) times daily.    Yes [provider]  Cholecalciferol (VITAMIN D3) 5000 units CAPS Take 5,000 Units by mouth daily.    Yes [provider]  DULoxetine (CYMBALTA) 30 MG capsule Take 30 mg by  mouth daily.    Yes [provider]  gabapentin (NEURONTIN) 800 MG tablet Take 800 mg by mouth 2 (two) times daily.  03/24/16  Yes [provider]  levothyroxine (SYNTHROID, LEVOTHROID) 100 MCG tablet Take 100 mcg by mouth daily before breakfast.   Yes [provider]  predniSONE (STERAPRED UNI-PAK 21 TAB) 10 MG (21) TBPK tablet Take 6 tabs (60mg ) day 1, 5 tabs (50mg ) day 2, 4 tabs (40mg ) day 3, 3 tabs (30mg ) day 4, 2 tabs (20mg ) day 5, and 1 tab (10mg ) day 6. 10/06/17   Naveed Humphres, Helane Gunther, PA-C    Family History Family History  Problem Relation Age of Onset  . Atrial fibrillation Mother     Social History Social History   Tobacco Use  . Smoking status: Current Every Day Smoker    Packs/day: 0.50    Years: 41.00    Pack years: 20.50  . Smokeless tobacco: Never Used  . Tobacco comment: 41 pack year h/o tobacco abuse, currently ~ 0.5 ppd.  Substance Use Topics  . Alcohol use: No  . Drug use: Yes    Types: Marijuana    Comment: 2 marijuana cigarettes/wk.     Allergies   Tetracyclines &  related   Review of Systems Review of Systems  Constitutional: Negative for chills, diaphoresis and fever.  HENT: Positive for congestion, rhinorrhea, sinus pressure and sore throat. Negative for facial swelling, trouble swallowing and voice change.   Respiratory: Positive for cough. Negative for shortness of breath.   Cardiovascular: Negative for chest pain and leg swelling.  Gastrointestinal: Negative for abdominal pain, diarrhea, nausea and vomiting.  Neurological: Negative for dizziness, light-headedness and headaches.  All other systems reviewed and are negative.    Physical Exam Updated Vital Signs BP 125/80 (BP Location: Left Arm)   Pulse (!) 102   Temp 98.6 F (37 C) (Oral)   Resp 18   Ht 5\' 7"  (1.702 m)   Wt 81.6 kg (180 lb)   SpO2 98%   BMI 28.19 kg/m   Physical Exam  Constitutional: She appears well-developed and well-nourished. No distress.  HENT:    Head: Normocephalic and atraumatic.  Nose: Right sinus exhibits maxillary sinus tenderness and frontal sinus tenderness. Left sinus exhibits maxillary sinus tenderness and frontal sinus tenderness.  Mouth/Throat: Oropharynx is clear and moist.  Eyes: Conjunctivae are normal.  Neck: Neck supple.  Cardiovascular: Normal rate, regular rhythm, normal heart sounds and intact distal pulses.  Pulmonary/Chest: Effort normal and breath sounds normal. No respiratory distress.  No increased work of breathing.  Speaks in full sentences without difficulty.  Abdominal: Soft. There is no tenderness. There is no guarding.  Musculoskeletal: She exhibits no edema.  Lymphadenopathy:    She has no cervical adenopathy.  Neurological: She is alert.  Skin: Skin is warm and dry. She is not diaphoretic.  Psychiatric: She has a normal mood and affect. Her behavior is normal.  Nursing note and vitals reviewed.    ED Treatments / Results  Labs (all labs ordered are listed, but only abnormal results are displayed) Labs Reviewed - No data to display  EKG None  Radiology Dg Chest 2 View  Result Date: 10/06/2017 CLINICAL DATA:  62 year old female with history of nasal congestion and cough. EXAM: CHEST - 2 VIEW COMPARISON:  Chest x-ray 04/10/2016. FINDINGS: Lung volumes are normal. No consolidative airspace disease. No pleural effusions. No pneumothorax. No pulmonary nodule or mass noted. Pulmonary vasculature and the cardiomediastinal silhouette are within normal limits. Orthopedic fixation hardware in the lower cervical spine. Surgical clips project over the right upper quadrant, presumably from prior cholecystectomy. IMPRESSION: No radiographic evidence of acute cardiopulmonary disease. Electronically Signed   By: Vinnie Langton M.D.   On: 10/06/2017 20:13    Procedures Procedures (including critical care time)  Medications Ordered in ED Medications  ipratropium-albuterol (DUONEB) 0.5-2.5 (3) MG/3ML  nebulizer solution 3 mL (3 mLs Nebulization Given 10/06/17 2023)  albuterol (PROVENTIL HFA;VENTOLIN HFA) 108 (90 Base) MCG/ACT inhaler 2 puff (2 puffs Inhalation Given 10/06/17 2023)  predniSONE (DELTASONE) tablet 60 mg (60 mg Oral Given 10/06/17 2023)     Initial Impression / Assessment and Plan / ED Course  I have reviewed the triage vital signs and the nursing notes.  Pertinent labs & imaging results that were available during my care of the patient were reviewed by me and considered in my medical decision making (see chart for details).  Clinical Course as of Oct 08 1330  Mon Aug 05, 401  6042 62 year old female with nasal congestion postnasal drip and cough on antibiotics by her PCP is here with not improving symptoms.  She is fairly benign exam here and a chest x-ray does not show an obvious  infiltrate.  Probably would need to add some steroids but I think she would be able to be discharged and follow-up with her PCP.   [MB]    Clinical Course User Index [MB] Hayden Rasmussen, MD    Patient presents with upper respiratory congestion with cough. Patient is nontoxic appearing, afebrile, not tachycardic on my exam, not tachypneic, not hypotensive, maintains excellent SPO2 on room air, and is in no apparent distress.  Her symptoms sound environmental versus viral, however, if they are bacterial she is already on an appropriate antibiotic.  No acute abnormalities on chest x-ray. The patient was given instructions for home care as well as return precautions. Patient voices understanding of these instructions, accepts the plan, and is comfortable with discharge.  Findings and plan of care discussed with Aletta Edouard, MD.   Vitals:   10/06/17 2000 10/06/17 2023 10/06/17 2030 10/06/17 2136  BP:    105/76  Pulse: 76  80 89  Resp:      Temp:    97.6 F (36.4 C)  TempSrc:    Oral  SpO2: 97% 98% 100% 99%  Weight:      Height:         Final Clinical Impressions(s) / ED Diagnoses    Final diagnoses:  Nasal congestion    ED Discharge Orders        Ordered    predniSONE (STERAPRED UNI-PAK 21 TAB) 10 MG (21) TBPK tablet     10/06/17 2057       Lorayne Bender, PA-C 10/07/17 1334    Hayden Rasmussen, MD 10/07/17 [phone]

## 2017-10-06 NOTE — ED Triage Notes (Signed)
Pt presents with c/o nasal congestion, cough. Pt reports that she was diagnosed with double ear infection, bronchitis, and acute sinusitis 5 days ago and placed on antibiotics. Pt reports that her symptoms are not getting any better and she is in fact, worse.

## 2017-10-06 NOTE — Discharge Instructions (Addendum)
Chest x-ray showed no acute abnormalities.  Hand washing: Wash your hands throughout the day, but especially before and after touching the face, using the restroom, sneezing, coughing, or touching surfaces that have been coughed or sneezed upon. Hydration: Symptoms will be intensified and complicated by dehydration. Dehydration can also extend the duration of symptoms. Drink plenty of fluids and get plenty of rest. You should be drinking at least half a liter of water an hour to stay hydrated. Electrolyte drinks (ex. Gatorade, Powerade, Pedialyte) are also encouraged. You should be drinking enough fluids to make your urine light yellow, almost clear. If this is not the case, you are not drinking enough water. Please note that some of the treatments indicated below will not be effective if you are not adequately hydrated. Pain or fever: Ibuprofen, Naproxen, or Tylenol for pain or fever.  Albuterol: May use the albuterol as needed for instances of shortness of breath. Prednisone: Take the prednisone, as directed, in its entirety. Zyrtec or Claritin: May add these medication daily to control underlying symptoms of congestion, sneezing, and other signs of allergies.  These medications are available over-the-counter. Flonase: Use this medication, as directed, for nasal and sinus congestion.  This medication is available over-the-counter. Congestion: Plain Mucinex may help relieve congestion. Saline sinus rinses and saline nasal sprays may also help relieve congestion. If you do not have heart problems or an allergy to such medications, you may also try phenylephrine or Sudafed. Sore throat: Warm liquids or Chloraseptic spray may help soothe a sore throat. Gargle twice a day with a salt water solution made from a half teaspoon of salt in a cup of warm water.  Follow up: Follow up with a primary care provider within the next two weeks should symptoms fail to resolve. Return: Return to the ED for significantly  worsening symptoms, shortness of breath, persistent vomiting, large amounts of blood in stool, or any other major concerns.

## 2017-10-06 NOTE — ED Notes (Signed)
Patient transported to X-ray 

## 2017-10-15 ENCOUNTER — Other Ambulatory Visit: Payer: Self-pay

## 2017-10-15 ENCOUNTER — Emergency Department (HOSPITAL_COMMUNITY)
Admission: EM | Admit: 2017-10-15 | Discharge: 2017-10-15 | Disposition: A | Discharge status: Home / Self Care | Payer: Medicare Other | Attending: Emergency Medicine | Admitting: Emergency Medicine

## 2017-10-15 ENCOUNTER — Encounter (HOSPITAL_COMMUNITY): Payer: Self-pay | Admitting: Obstetrics and Gynecology

## 2017-10-15 DIAGNOSIS — E039 Hypothyroidism, unspecified: Secondary | ICD-10-CM | POA: Insufficient documentation

## 2017-10-15 DIAGNOSIS — Z86718 Personal history of other venous thrombosis and embolism: Secondary | ICD-10-CM | POA: Insufficient documentation

## 2017-10-15 DIAGNOSIS — K029 Dental caries, unspecified: Secondary | ICD-10-CM | POA: Insufficient documentation

## 2017-10-15 DIAGNOSIS — F17210 Nicotine dependence, cigarettes, uncomplicated: Secondary | ICD-10-CM | POA: Insufficient documentation

## 2017-10-15 DIAGNOSIS — Z79899 Other long term (current) drug therapy: Secondary | ICD-10-CM | POA: Insufficient documentation

## 2017-10-15 DIAGNOSIS — F1290 Cannabis use, unspecified, uncomplicated: Secondary | ICD-10-CM | POA: Insufficient documentation

## 2017-10-15 DIAGNOSIS — K047 Periapical abscess without sinus: Secondary | ICD-10-CM

## 2017-10-15 DIAGNOSIS — K0889 Other specified disorders of teeth and supporting structures: Secondary | ICD-10-CM | POA: Diagnosis not present

## 2017-10-15 MED ORDER — KETOROLAC TROMETHAMINE 15 MG/ML IJ SOLN
15.0000 mg | Freq: Once | INTRAMUSCULAR | Status: AC
Start: 2017-10-15 — End: 2017-10-15
  Administered 2017-10-15: 15 mg via INTRAMUSCULAR
  Filled 2017-10-15: qty 1

## 2017-10-15 MED ORDER — CLINDAMYCIN HCL 300 MG PO CAPS
300.0000 mg | ORAL_CAPSULE | Freq: Once | ORAL | Status: AC
  Administered 2017-10-15: 300 mg via ORAL
  Filled 2017-10-15: qty 1

## 2017-10-15 MED ORDER — CLINDAMYCIN HCL 300 MG PO CAPS: 300.0000 mg | ORAL_CAPSULE | Freq: Three times a day (TID) | ORAL | 0 refills | Status: AC

## 2017-10-15 MED ORDER — MELOXICAM 7.5 MG PO TABS: 7.5000 mg | ORAL_TABLET | Freq: Every day | ORAL | 0 refills | Status: AC

## 2017-10-15 NOTE — ED Provider Notes (Signed)
[facility] DEPT Provider Note   CSN: [id number] Arrival date & time: 10/15/17  1457     History   Chief Complaint Chief Complaint  Patient presents with  . Dental Pain    HPI Alexandra Harvey is a 62 y.o. female.  62 year old female presents with complaint of dental pain.  Patient states her symptoms started about 1 month ago, went to her PCP with complaint of drainage from her eyes, nose, cough, diagnosed with sinusitis and bronchitis.  Patient was given penicillin from her PCP but did not feel she was improving so she presented to the emergency room where she was given prednisone.  Patient states that she felt like maybe she was improving however she now has pain in her teeth and believes that the source of all of her problems in the first place was a dental infection.  Patient denies fevers, chills, drainage from her teeth or trauma.  Patient states that she had work done on 2 upper teeth, left and right sides several years ago, has the metal appliance in place however the tooth has decayed around it.  No other complaints or concerns today.     Past Medical History:  Diagnosis Date  . Anxiety   . Chest pain    a. reports 3 nl stress tests in [place] over past 10 years.  . Depression   . DVT (deep venous thrombosis) ([facility])    a. 2016 R DVT after knee surgery  tx w/ coumadin x 2 mos.  . Fibromyalgia   . GERD (gastroesophageal reflux disease)   . Hyperlipidemia   . Hypothyroidism   . Seasonal allergies   . Snoring    a. sometimes awakes @ night gasping.    There are no active problems to display for this patient.   Past Surgical History:  Procedure Laterality Date  . CARPAL TUNNEL RELEASE Right   . CERVICAL SPINE SURGERY    . CHOLECYSTECTOMY    . ELBOW SURGERY Right   . FOOT SURGERY Right   . KNEE ARTHROSCOPY Bilateral   . SHOULDER SURGERY Right   . TOTAL ABDOMINAL HYSTERECTOMY    . TOTAL KNEE ARTHROPLASTY Right      OB History    None      Home Medications    Prior to Admission medications   Medication Sig Start Date End Date Taking? Authorizing Provider  baclofen (LIORESAL) 10 MG tablet Take 20 mg by mouth 2 (two) times daily.     [provider]  Cholecalciferol (VITAMIN D3) 5000 units CAPS Take 5,000 Units by mouth daily.     [provider]  clindamycin (CLEOCIN) 300 MG capsule Take 1 capsule (300 mg total) by mouth 3 (three) times daily for 10 days. 10/15/17 10/25/17  Tacy Learn, PA-C  DULoxetine (CYMBALTA) 30 MG capsule Take 30 mg by mouth daily.     [provider]  gabapentin (NEURONTIN) 800 MG tablet Take 800 mg by mouth 2 (two) times daily.  03/24/16   [provider]  levothyroxine (SYNTHROID, LEVOTHROID) 100 MCG tablet Take 100 mcg by mouth daily before breakfast.    [provider]  meloxicam (MOBIC) 7.5 MG tablet Take 1 tablet (7.5 mg total) by mouth daily for 10 days. 10/15/17 10/25/17  Tacy Learn, PA-C    Family History Family History  Problem Relation Age of Onset  . Atrial fibrillation Mother     Social History Social History   Tobacco Use  . Smoking  status: Current Every Day Smoker    Packs/day: 0.50    Years: 41.00    Pack years: 20.50  . Smokeless tobacco: Never Used  . Tobacco comment: 41 pack year h/o tobacco abuse, currently ~ 0.5 ppd.  Substance Use Topics  . Alcohol use: No  . Drug use: Yes    Types: Marijuana    Comment: 2 marijuana cigarettes/wk.     Allergies   Tetracyclines & related   Review of Systems Review of Systems  Constitutional: Negative for chills and fever.  HENT: Positive for dental problem. Negative for congestion, drooling, ear pain, facial swelling, trouble swallowing and voice change.   Gastrointestinal: Negative for vomiting.  Musculoskeletal: Negative for neck pain and neck stiffness.  Skin: Negative for rash and wound.  Allergic/Immunologic: Negative for immunocompromised state.   Neurological: Negative for weakness and headaches.  Hematological: Negative for adenopathy.  Psychiatric/Behavioral: Negative for confusion.  All other systems reviewed and are negative.    Physical Exam Updated Vital Signs BP 122/90 (BP Location: Right Arm)   Pulse (!) 116   Temp 98.3 F (36.8 C) (Oral)   Resp 16   Ht 5\' 7"  (1.702 m)   Wt 79.4 kg   SpO2 99%   BMI 27.41 kg/m   Physical Exam  Constitutional: She is oriented to person, place, and time. She appears well-developed and well-nourished. No distress.  HENT:  Head: Normocephalic and atraumatic.  Mouth/Throat: Uvula is midline and oropharynx is clear and moist. No trismus in the jaw. Abnormal dentition. Dental caries present. No dental abscesses or uvula swelling.    Cardiovascular: Intact distal pulses.  Pulmonary/Chest: Effort normal.  Lymphadenopathy:    She has no cervical adenopathy.  Neurological: She is alert and oriented to person, place, and time.  Skin: Skin is warm and dry. No rash noted. She is not diaphoretic.  Psychiatric: She has a normal mood and affect. Her behavior is normal.  Nursing note and vitals reviewed.    ED Treatments / Results  Labs (all labs ordered are listed, but only abnormal results are displayed) Labs Reviewed - No data to display  EKG None  Radiology No results found.  Procedures Procedures (including critical care time)  Medications Ordered in ED Medications  ketorolac (TORADOL) 15 MG/ML injection 15 mg (has no administration in time range)  clindamycin (CLEOCIN) capsule 300 mg (300 mg Oral Given 10/15/17 1542)     Initial Impression / Assessment and Plan / ED Course  I have reviewed the triage vital signs and the nursing notes.  Pertinent labs & imaging results that were available during my care of the patient were reviewed by me and considered in my medical decision making (see chart for details).  Clinical Course as of Oct 16 1542  Wed Oct 15, 7612  [id number]  62 year old female presents with complaint of dental pain x1 month.  Denies fever, drainage, trauma.  On exam has left and right upper teeth with decay with remaining dental appliance.  No obvious abscess, no trismus.  Patient was given clindamycin, recently treated with penicillin or Augmentin.  Also given dental referral list and prescription for meloxicam for pain.   [LM]    Clinical Course User Index [LM] Tacy Learn, PA-C    Final Clinical Impressions(s) / ED Diagnoses   Final diagnoses:  Pain, dental  Dental infection    ED Discharge Orders         Ordered    clindamycin (CLEOCIN) 300 MG capsule  3 times daily     10/15/17 1532    meloxicam (MOBIC) 7.5 MG tablet  Daily     10/15/17 1532           Roque Lias 10/15/17 1544    Blanchie Dessert, MD 10/16/17 [phone]

## 2017-10-15 NOTE — ED Triage Notes (Signed)
Pt presents to the ED with complaint of "mouth infection". Pt reports she has been diagnosed in the past with sinusitis and she believes it is in her mouth. Pt reports she was also diagnosed with a double ear infection and bronchitis in the past but she thinks it is elsewhere. Pt is very anxious at this time.

## 2017-11-03 ENCOUNTER — Encounter (HOSPITAL_COMMUNITY): Payer: Self-pay

## 2017-11-03 ENCOUNTER — Other Ambulatory Visit: Payer: Self-pay

## 2017-11-03 ENCOUNTER — Emergency Department (HOSPITAL_COMMUNITY)
Admission: EM | Admit: 2017-11-03 | Discharge: 2017-11-03 | Disposition: A | Discharge status: Home / Self Care | Payer: Medicare Other | Attending: Emergency Medicine | Admitting: Emergency Medicine

## 2017-11-03 DIAGNOSIS — F17210 Nicotine dependence, cigarettes, uncomplicated: Secondary | ICD-10-CM | POA: Insufficient documentation

## 2017-11-03 DIAGNOSIS — Z79899 Other long term (current) drug therapy: Secondary | ICD-10-CM | POA: Insufficient documentation

## 2017-11-03 DIAGNOSIS — K0889 Other specified disorders of teeth and supporting structures: Secondary | ICD-10-CM | POA: Diagnosis present

## 2017-11-03 DIAGNOSIS — K1379 Other lesions of oral mucosa: Secondary | ICD-10-CM | POA: Diagnosis not present

## 2017-11-03 DIAGNOSIS — E039 Hypothyroidism, unspecified: Secondary | ICD-10-CM | POA: Diagnosis not present

## 2017-11-03 MED ORDER — LIDOCAINE VISCOUS HCL 2 % MT SOLN: 15.0000 mL | OROMUCOSAL | 2 refills | Status: DC | PRN

## 2017-11-03 NOTE — Discharge Instructions (Addendum)
°  Dental Pain You have been seen today for dental pain. Please be sure to keep your dental appointment.  Additional resources have been included in case this appointment falls through for some reason.  Use ibuprofen or naproxen for pain. Use the viscous lidocaine for mouth pain. Swish with the lidocaine and spit it out. Do not swallow it. You should also swish with a homemade salt water solution, twice a day.  Make this solution by mixing 8 ounces of warm water with about half a teaspoon of salt.  Antiinflammatory medications: Take 600 mg of ibuprofen every 6 hours or 440 mg (over the counter dose) to 500 mg (prescription dose) of naproxen every 12 hours for the next 3 days. After this time, these medications may be used as needed for pain. Take these medications with food to avoid upset stomach. Choose only one of these medications, do not take them together. Acetaminophen (generic for Tylenol): Should you continue to have additional pain while taking the ibuprofen or naproxen, you may add in acetaminophen as needed. Your daily total maximum amount of acetaminophen from all sources should be limited to 4000mg /day for persons without liver problems, or 2000mg /day for those with liver problems.  [address] [place], [place] [address] [phone]  [place], [place] [address] [phone]  [facility] [address] [place], [place] [address] [phone] ext. [facility] [address] [place] [address] [place], [place] [address] [phone]  [facility] [address] [place], [place] [address] [phone]  [facility] [employer] [email or web address]  [facility] [address] [place], [place] [address] [phone]  Website for free, low-income, or sliding scale  dental services in : www.freedental.us  To find a dentist in [place] and surrounding areas: CardCollectible.com.ee  [facility] MusicClient.gl  [facility] Medicaid Dentist [email or web address]

## 2017-11-03 NOTE — ED Notes (Signed)
Patient verbalized understanding of discharge instructions, no questions. Patent ambulated out of ED with steady gait in no distress.

## 2017-11-03 NOTE — ED Provider Notes (Signed)
[facility] DEPT Provider Note   CSN: [id number] Arrival date & time: 11/03/17  0730     History   Chief Complaint Chief Complaint  Patient presents with  . Dental Pain    HPI Alexandra Harvey is a 62 y.o. female.  HPI   Alexandra Harvey is a 62 y.o. female, with a history of DVT, fibromyalgia, hypothyroidism, and GERD, presenting to the ED with mouth pain for at least a month.  She insists she has a "mouth infection, probably from my teeth."  Pain is aching/throbbing, mostly on the left upper and lower mouth, moderate to severe, radiating throughout the mouth and jaw.  Accompanied by intermittent nausea and intermittent subjective fever.  She insists she has "pus coming out everywhere.  It's in my stool, coming out of by mouth, nose, and even from under my fingernails." She was seen in the ED for similar complaint on August 14 and prescribed 10 days of clindamycin, which she states helped. States she was able to secure a dental appointment for tomorrow, but the dentist has told her she wants her on antibiotics prior to the visit.  Denies vomiting, diarrhea, abdominal pain, cough, shortness of breath, chest pain, difficulty swallowing, or any other complaints.    Past Medical History:  Diagnosis Date  . Anxiety   . Chest pain    a. reports 3 nl stress tests in [place] over past 10 years.  . Depression   . DVT (deep venous thrombosis) ([place])    a. 2016 R DVT after knee surgery  tx w/ coumadin x 2 mos.  . Fibromyalgia   . GERD (gastroesophageal reflux disease)   . Hyperlipidemia   . Hypothyroidism   . Seasonal allergies   . Snoring    a. sometimes awakes @ night gasping.    There are no active problems to display for this patient.   Past Surgical History:  Procedure Laterality Date  . CARPAL TUNNEL RELEASE Right   . CERVICAL SPINE SURGERY    . CHOLECYSTECTOMY    . ELBOW SURGERY Right   . FOOT SURGERY Right   . KNEE ARTHROSCOPY Bilateral   .  SHOULDER SURGERY Right   . TOTAL ABDOMINAL HYSTERECTOMY    . TOTAL KNEE ARTHROPLASTY Right      OB History   None      Home Medications    Prior to Admission medications   Medication Sig Start Date End Date Taking? Authorizing Provider  B Complex Vitamins (VITAMIN B COMPLEX PO) Take 1 tablet by mouth daily.   Yes [provider]  baclofen (LIORESAL) 10 MG tablet Take 20 mg by mouth 2 (two) times daily.    Yes [provider]  Cetirizine HCl 10 MG CAPS Take 10 mg by mouth daily.   Yes [provider]  DULoxetine (CYMBALTA) 30 MG capsule Take 30 mg by mouth daily.    Yes [provider]  gabapentin (NEURONTIN) 800 MG tablet Take 800 mg by mouth 2 (two) times daily.  03/24/16  Yes [provider]  levothyroxine (SYNTHROID, LEVOTHROID) 100 MCG tablet Take 100 mcg by mouth daily before breakfast.   Yes [provider]  VITAMIN E PO Take 1 tablet by mouth daily.   Yes [provider]  lidocaine (XYLOCAINE) 2 % solution Use as directed 15 mLs in the mouth or throat as needed for mouth pain. 11/03/17   Lorayne Bender, PA-C    Family History Family History  Problem Relation  Age of Onset  . Atrial fibrillation Mother     Social History Social History   Tobacco Use  . Smoking status: Current Every Day Smoker    Packs/day: 0.50    Years: 41.00    Pack years: 20.50  . Smokeless tobacco: Never Used  . Tobacco comment: 41 pack year h/o tobacco abuse, currently ~ 0.5 ppd.  Substance Use Topics  . Alcohol use: No  . Drug use: Yes    Types: Marijuana    Comment: 2 marijuana cigarettes/wk.     Allergies   Tetracyclines & related   Review of Systems Review of Systems  Constitutional: Positive for fever (subjective, intermittent). Negative for diaphoresis.  HENT: Positive for dental problem. Negative for drooling, facial swelling, sore throat, trouble swallowing and voice change.   Respiratory: Negative for shortness of  breath.   Cardiovascular: Negative for chest pain.  Gastrointestinal: Positive for nausea. Negative for abdominal pain, blood in stool, diarrhea and vomiting.  Musculoskeletal: Negative for back pain and neck stiffness.  Skin: Negative for wound.  Neurological: Negative for headaches.  All other systems reviewed and are negative.    Physical Exam Updated Vital Signs BP (!) 141/78   Pulse 90   Temp 98.4 F (36.9 C) (Oral)   Resp 15   SpO2 100%   Physical Exam  Constitutional: She appears well-developed and well-nourished. No distress.  HENT:  Head: Normocephalic and atraumatic.  Patient's remaining teeth are all in the anterior portion of the mouth.  She endorses tenderness to the left mandibular and maxillary tooth free gingiva.  No intraoral swelling, fluctuance, color change, or discharge. Remaining dentition appears to be intact and stable.  No trismus.  Mouth opening to at least 3 finger widths.  Handles oral secretions without difficulty.  No noted facial swelling.  No swelling or tenderness to the submental or submandibular regions.  No swelling or tenderness into the soft tissues of the neck.  Eyes: Conjunctivae are normal.  Neck: Normal range of motion. Neck supple.  Cardiovascular: Normal rate, regular rhythm, normal heart sounds and intact distal pulses.  Pulmonary/Chest: Effort normal and breath sounds normal. No respiratory distress.  Abdominal: Soft. There is no tenderness. There is no guarding.  Musculoskeletal: She exhibits no edema.  Lymphadenopathy:    She has no cervical adenopathy.  Neurological: She is alert.  Skin: Skin is warm and dry. She is not diaphoretic.  Psychiatric: She has a normal mood and affect. Her behavior is normal.  Nursing note and vitals reviewed.    ED Treatments / Results  Labs (all labs ordered are listed, but only abnormal results are displayed) Labs Reviewed - No data to display  EKG None  Radiology No results  found.  Procedures Procedures (including critical care time)  Medications Ordered in ED Medications - No data to display   Initial Impression / Assessment and Plan / ED Course  I have reviewed the triage vital signs and the nursing notes.  Pertinent labs & imaging results that were available during my care of the patient were reviewed by me and considered in my medical decision making (see chart for details).     Patient presents with mouth pain. Patient is nontoxic appearing, afebrile, not tachycardic, not tachypneic, not hypotensive, SPO2 of 100% on room air, and is in no apparent distress.  There is no swelling, fluctuance, color change, lesions, or any other abnormality beyond diffuse tenderness noted on exam. Due to the patient's recent prolonged antibiotic use (at  least two courses over the past month) and the lack of findings consistent with abscess or other signs of infection, I do not find it prudent at this time to prescribe another dose of antibiotics. The patient was given instructions for home care as well as return precautions. Patient voices understanding of these instructions, accepts the plan, and is comfortable with discharge.   Findings and plan of care discussed with Pattricia Boss, MD. Dr. Jeanell Sparrow personally evaluated and examined this patient.   Final Clinical Impressions(s) / ED Diagnoses   Final diagnoses:  Mouth pain    ED Discharge Orders         Ordered    lidocaine (XYLOCAINE) 2 % solution  As needed     11/03/17 0842           Lorayne Bender, PA-C 11/03/17 2984    Pattricia Boss, MD 11/03/17 [phone]

## 2017-11-03 NOTE — ED Triage Notes (Addendum)
Pt reports that she has a "mouth infection" Pt reports that this issue has been ongoing for months. Pt reports that she has been unable to find a dentist. Pt reports that she is supposed to see a dentist tomorrow but the dentist wants pt to be on abx prior to visit. Pt states " the infection is coming from every where. Its draining down the back of my throat, its coming out of my nose and out of my legs." Pt reports she is having head and neck pain.

## 2017-11-20 DIAGNOSIS — R21 Rash and other nonspecific skin eruption: Secondary | ICD-10-CM | POA: Diagnosis not present

## 2017-11-20 DIAGNOSIS — R232 Flushing: Secondary | ICD-10-CM | POA: Diagnosis not present

## 2017-11-20 DIAGNOSIS — E039 Hypothyroidism, unspecified: Secondary | ICD-10-CM | POA: Diagnosis not present

## 2017-11-20 DIAGNOSIS — H538 Other visual disturbances: Secondary | ICD-10-CM | POA: Diagnosis not present

## 2017-11-20 DIAGNOSIS — R0981 Nasal congestion: Secondary | ICD-10-CM | POA: Diagnosis not present

## 2017-11-27 DIAGNOSIS — Z23 Encounter for immunization: Secondary | ICD-10-CM | POA: Diagnosis not present

## 2017-12-19 DIAGNOSIS — L309 Dermatitis, unspecified: Secondary | ICD-10-CM | POA: Diagnosis not present

## 2017-12-23 ENCOUNTER — Encounter (HOSPITAL_COMMUNITY): Payer: Self-pay | Admitting: Emergency Medicine

## 2017-12-23 ENCOUNTER — Other Ambulatory Visit: Payer: Self-pay

## 2017-12-23 ENCOUNTER — Emergency Department (HOSPITAL_COMMUNITY)
Admission: EM | Admit: 2017-12-23 | Discharge: 2017-12-24 | Disposition: A | Discharge status: Home / Self Care | Payer: Medicare Other | Attending: Emergency Medicine | Admitting: Emergency Medicine

## 2017-12-23 ENCOUNTER — Emergency Department (HOSPITAL_COMMUNITY): Payer: Medicare Other

## 2017-12-23 DIAGNOSIS — K0889 Other specified disorders of teeth and supporting structures: Secondary | ICD-10-CM | POA: Insufficient documentation

## 2017-12-23 DIAGNOSIS — R0602 Shortness of breath: Secondary | ICD-10-CM | POA: Diagnosis not present

## 2017-12-23 DIAGNOSIS — E039 Hypothyroidism, unspecified: Secondary | ICD-10-CM | POA: Insufficient documentation

## 2017-12-23 DIAGNOSIS — F17210 Nicotine dependence, cigarettes, uncomplicated: Secondary | ICD-10-CM | POA: Insufficient documentation

## 2017-12-23 DIAGNOSIS — R002 Palpitations: Secondary | ICD-10-CM | POA: Insufficient documentation

## 2017-12-23 DIAGNOSIS — Z79899 Other long term (current) drug therapy: Secondary | ICD-10-CM | POA: Diagnosis not present

## 2017-12-23 DIAGNOSIS — Z96651 Presence of right artificial knee joint: Secondary | ICD-10-CM | POA: Insufficient documentation

## 2017-12-23 LAB — CBC WITH DIFFERENTIAL/PLATELET
Abs Immature Granulocytes: 0.02 10*3/uL (ref 0.00–0.07)
Basophils Absolute: 0.1 10*3/uL (ref 0.0–0.1)
Basophils Relative: 1 %
Eosinophils Absolute: 0.5 10*3/uL (ref 0.0–0.5)
Eosinophils Relative: 4 %
HEMATOCRIT: 37 % (ref 36.0–46.0)
Hemoglobin: 11.7 g/dL — ABNORMAL LOW (ref 12.0–15.0)
IMMATURE GRANULOCYTES: 0 %
LYMPHS ABS: 4.5 10*3/uL — AB (ref 0.7–4.0)
Lymphocytes Relative: 42 %
MCH: 29.5 pg (ref 26.0–34.0)
MCHC: 31.6 g/dL (ref 30.0–36.0)
MCV: 93.4 fL (ref 80.0–100.0)
MONO ABS: 0.6 10*3/uL (ref 0.1–1.0)
MONOS PCT: 6 %
Neutro Abs: 4.9 10*3/uL (ref 1.7–7.7)
Neutrophils Relative %: 47 %
Platelets: 288 10*3/uL (ref 150–400)
RBC: 3.96 MIL/uL (ref 3.87–5.11)
RDW: 12.7 % (ref 11.5–15.5)
WBC: 10.6 10*3/uL — ABNORMAL HIGH (ref 4.0–10.5)
nRBC: 0 % (ref 0.0–0.2)

## 2017-12-23 LAB — COMPREHENSIVE METABOLIC PANEL
ALBUMIN: 3.7 g/dL (ref 3.5–5.0)
ALK PHOS: 42 U/L (ref 38–126)
ALT: 13 U/L (ref 0–44)
ANION GAP: 7 (ref 5–15)
AST: 15 U/L (ref 15–41)
BUN: 7 mg/dL (ref 6–20)
CO2: 25 mmol/L (ref 22–32)
CREATININE: 0.65 mg/dL (ref 0.44–1.00)
Calcium: 8.4 mg/dL — ABNORMAL LOW (ref 8.9–10.3)
Chloride: 109 mmol/L (ref 98–111)
GFR calc Af Amer: >60 mL/min (ref >60)
GFR calc non Af Amer: >60 mL/min (ref >60)
GLUCOSE: 97 mg/dL (ref 70–99)
Potassium: 4 mmol/L (ref 3.5–5.1)
SODIUM: 141 mmol/L (ref 135–145)
Total Bilirubin: 0.1 mg/dL — ABNORMAL LOW (ref 0.3–1.2)
Total Protein: 6.3 g/dL — ABNORMAL LOW (ref 6.5–8.1)

## 2017-12-23 LAB — I-STAT TROPONIN, ED: Troponin i, poc: 0 ng/mL (ref 0.00–0.08)

## 2017-12-23 LAB — D-DIMER, QUANTITATIVE (NOT AT ARMC): D DIMER QUANT: 0.31 ug{FEU}/mL (ref 0.00–0.50)

## 2017-12-23 NOTE — ED Provider Notes (Signed)
[facility] DEPT Provider Note   CSN: [id number] Arrival date & time: 12/23/17  1413     History   Chief Complaint Chief Complaint  Patient presents with  . Abscess  . Dental Pain  . Shortness of Breath    HPI Alexandra Harvey is a 62 y.o. female.  62yo F w/ PMH including fibromyalgia, HLD, GERD, anxiety, DVT after surgery who p/w multiple complaints.  Patient states that she has been having ongoing problems with abscesses in her mouth.  She was evaluated by a dentist 2 weeks ago and told that she needs $5600 worth of surgery which she currently can't afford. She was given a week of clindamycin which she took but she still has pain.  She also endorses not feeling good for several months and intermittent shortness of breath that now is more constant.  Her shortness of breath is not related to exertion.  She reports associated lightheadedness, heart palpitations, feeling clammy, occasional sharp chest pains, and dry skin.  No URI symptoms, urinary symptoms, abdominal pain, vomiting, or fevers.  She endorses chronic diarrhea. She reports feeling anxious sometimes.  The history is provided by the patient.  Abscess  Dental Pain    Shortness of Breath     Past Medical History:  Diagnosis Date  . Anxiety   . Chest pain    a. reports 3 nl stress tests in [place] over past 10 years.  . Depression   . DVT (deep venous thrombosis) ([place])    a. 2016 R DVT after knee surgery  tx w/ coumadin x 2 mos.  . Fibromyalgia   . GERD (gastroesophageal reflux disease)   . Hyperlipidemia   . Hypothyroidism   . Seasonal allergies   . Snoring    a. sometimes awakes @ night gasping.    There are no active problems to display for this patient.   Past Surgical History:  Procedure Laterality Date  . CARPAL TUNNEL RELEASE Right   . CERVICAL SPINE SURGERY    . CHOLECYSTECTOMY    . ELBOW SURGERY Right   . FOOT SURGERY Right   . KNEE ARTHROSCOPY Bilateral   . SHOULDER  SURGERY Right   . TOTAL ABDOMINAL HYSTERECTOMY    . TOTAL KNEE ARTHROPLASTY Right      OB History   None      Home Medications    Prior to Admission medications   Medication Sig Start Date End Date Taking? Authorizing Provider  acetaminophen (TYLENOL) 500 MG tablet Take 500 mg by mouth every 6 (six) hours as needed for moderate pain.   Yes [provider]  B Complex Vitamins (VITAMIN B COMPLEX PO) Take 1 tablet by mouth daily.   Yes [provider]  baclofen (LIORESAL) 10 MG tablet Take 20 mg by mouth 2 (two) times daily.    Yes [provider]  Cetirizine HCl 10 MG CAPS Take 10 mg by mouth daily.   Yes [provider]  DULoxetine (CYMBALTA) 30 MG capsule Take 30 mg by mouth daily.    Yes [provider]  gabapentin (NEURONTIN) 800 MG tablet Take 800 mg by mouth 2 (two) times daily.  03/24/16  Yes [provider]  ibuprofen (ADVIL,MOTRIN) 200 MG tablet Take 200 mg by mouth every 6 (six) hours as needed for moderate pain.   Yes [provider]  levothyroxine (SYNTHROID, LEVOTHROID) 100 MCG tablet Take 100 mcg by mouth daily before breakfast.   Yes [provider]  VITAMIN  E PO Take 1 tablet by mouth daily.   Yes [provider]  lidocaine (XYLOCAINE) 2 % solution Use as directed 15 mLs in the mouth or throat as needed for mouth pain. Patient not taking: Reported on 12/23/2017 11/03/17   Lorayne Bender, PA-C    Family History Family History  Problem Relation Age of Onset  . Atrial fibrillation Mother     Social History Social History   Tobacco Use  . Smoking status: Current Every Day Smoker    Packs/day: 0.50    Years: 41.00    Pack years: 20.50  . Smokeless tobacco: Never Used  . Tobacco comment: 41 pack year h/o tobacco abuse, currently ~ 0.5 ppd.  Substance Use Topics  . Alcohol use: No  . Drug use: Yes    Types: Marijuana    Comment: 2 marijuana cigarettes/wk.     Allergies     Tetracyclines & related   Review of Systems Review of Systems  Respiratory: Positive for shortness of breath.    All other systems reviewed and are negative except that which was mentioned in HPI   Physical Exam Updated Vital Signs BP (!) 148/80   Pulse 68   Temp 98.5 F (36.9 C) (Axillary)   Resp 18   Ht 5\' 7"  (1.702 m)   Wt 78 kg   SpO2 97%   BMI 26.94 kg/m   Physical Exam  Constitutional: She is oriented to person, place, and time. She appears well-developed and well-nourished. No distress.  HENT:  Head: Normocephalic and atraumatic.  Moist mucous membranes Multiple missing teeth, no broken teeth or gumline swelling, no facial swelling  Eyes: Conjunctivae are normal.  Neck: Neck supple.  Cardiovascular: Normal rate, regular rhythm and normal heart sounds.  No murmur heard. Pulmonary/Chest: Effort normal and breath sounds normal.  Abdominal: Soft. Bowel sounds are normal. She exhibits no distension. There is no tenderness.  Musculoskeletal: She exhibits no edema.  Neurological: She is alert and oriented to person, place, and time.  Fluent speech  Skin: Skin is warm and dry.  Psychiatric: Judgment normal. Her mood appears anxious.  Depressed mood  Nursing note and vitals reviewed.    ED Treatments / Results  Labs (all labs ordered are listed, but only abnormal results are displayed) Labs Reviewed  COMPREHENSIVE METABOLIC PANEL - Abnormal; Notable for the following components:      Result Value   Calcium 8.4 (*)    Total Protein 6.3 (*)    Total Bilirubin 0.1 (*)    All other components within normal limits  CBC WITH DIFFERENTIAL/PLATELET - Abnormal; Notable for the following components:   WBC 10.6 (*)    Hemoglobin 11.7 (*)    Lymphs Abs 4.5 (*)    All other components within normal limits  D-DIMER, QUANTITATIVE (NOT AT [facility])  I-STAT TROPONIN, ED    EKG EKG Interpretation  Date/Time:  Tuesday December 23 2017 14:42:29 EDT Ventricular Rate:  92 PR  Interval:    QRS Duration: 109 QT Interval:  349 QTC Calculation: 432 R Axis:   86 Text Interpretation:  Sinus rhythm LAE, consider biatrial enlargement Consider right ventricular hypertrophy Borderline T abnormalities, anterior leads No significant change since last tracing Confirmed by Theotis Burrow [phone]) on 12/23/2017 8:54:05 PM   Radiology Dg Chest 2 View  Result Date: 12/23/2017 CLINICAL DATA:  Shortness of breath EXAM: CHEST - 2 VIEW COMPARISON:  10/06/2017 FINDINGS: Peribronchial thickening. Heart is borderline in size. No confluent opacities or effusions.  No acute bony abnormality. IMPRESSION: Borderline heart size. Mild bronchitic changes. Electronically Signed   By: Rolm Baptise M.D.   On: 12/23/2017 21:58    Procedures Procedures (including critical care time)  Medications Ordered in ED Medications - No data to display   Initial Impression / Assessment and Plan / ED Course  I have reviewed the triage vital signs and the nursing notes.  Pertinent labs & imaging results that were available during my care of the patient were reviewed by me and considered in my medical decision making (see chart for details).    Comfortable on exam, reassuring vital signs.  No ischemic changes on EKG.  Chest x-ray unremarkable.  Lab work shows negative troponin, negative d-dimer, reassuring CMP and CBC.  Given the chronicity of her symptoms, I doubt life-threatening process regarding her shortness of breath.  I have instructed her to follow-up with PCP regarding her symptoms and reviewed return precautions.  She voiced understanding.  Final Clinical Impressions(s) / ED Diagnoses   Final diagnoses:  Shortness of breath  Pain, dental  Heart palpitations    ED Discharge Orders    None       Shivonne Schwartzman, Wenda Overland, MD 12/23/17 2356

## 2017-12-23 NOTE — ED Triage Notes (Signed)
Pt c/o known abscesses in mouth, pt reports 3 for several months with intermittent shortness of breath. Pt states pain is now worse and shortness of breath is constant.

## 2017-12-23 NOTE — ED Notes (Addendum)
Unable to collect temp due to pt drinking heated liquid. Will reassess at later time

## 2017-12-30 ENCOUNTER — Emergency Department (HOSPITAL_COMMUNITY): Payer: Medicare Other

## 2017-12-30 ENCOUNTER — Emergency Department (HOSPITAL_COMMUNITY)
Admission: EM | Admit: 2017-12-30 | Discharge: 2017-12-31 | Disposition: A | Discharge status: Home / Self Care | Payer: Medicare Other | Attending: Emergency Medicine | Admitting: Emergency Medicine

## 2017-12-30 ENCOUNTER — Encounter (HOSPITAL_COMMUNITY): Payer: Self-pay

## 2017-12-30 DIAGNOSIS — F17200 Nicotine dependence, unspecified, uncomplicated: Secondary | ICD-10-CM | POA: Insufficient documentation

## 2017-12-30 DIAGNOSIS — E039 Hypothyroidism, unspecified: Secondary | ICD-10-CM | POA: Insufficient documentation

## 2017-12-30 DIAGNOSIS — K921 Melena: Secondary | ICD-10-CM | POA: Insufficient documentation

## 2017-12-30 DIAGNOSIS — Z79899 Other long term (current) drug therapy: Secondary | ICD-10-CM | POA: Insufficient documentation

## 2017-12-30 DIAGNOSIS — Z96651 Presence of right artificial knee joint: Secondary | ICD-10-CM | POA: Diagnosis not present

## 2017-12-30 DIAGNOSIS — R05 Cough: Secondary | ICD-10-CM | POA: Diagnosis not present

## 2017-12-30 DIAGNOSIS — Z8719 Personal history of other diseases of the digestive system: Secondary | ICD-10-CM

## 2017-12-30 DIAGNOSIS — R499 Unspecified voice and resonance disorder: Secondary | ICD-10-CM | POA: Diagnosis not present

## 2017-12-30 DIAGNOSIS — R059 Cough, unspecified: Secondary | ICD-10-CM

## 2017-12-30 DIAGNOSIS — R0989 Other specified symptoms and signs involving the circulatory and respiratory systems: Secondary | ICD-10-CM | POA: Insufficient documentation

## 2017-12-30 LAB — CBC WITH DIFFERENTIAL/PLATELET
Abs Immature Granulocytes: 0.02 10*3/uL (ref 0.00–0.07)
BASOS ABS: 0.1 10*3/uL (ref 0.0–0.1)
Basophils Relative: 1 %
EOS PCT: 2 %
Eosinophils Absolute: 0.2 10*3/uL (ref 0.0–0.5)
HEMATOCRIT: 42.8 % (ref 36.0–46.0)
HEMOGLOBIN: 14 g/dL (ref 12.0–15.0)
Immature Granulocytes: 0 %
LYMPHS ABS: 4.1 10*3/uL — AB (ref 0.7–4.0)
LYMPHS PCT: 44 %
MCH: 29.4 pg (ref 26.0–34.0)
MCHC: 32.7 g/dL (ref 30.0–36.0)
MCV: 89.9 fL (ref 80.0–100.0)
MONOS PCT: 7 %
Monocytes Absolute: 0.7 10*3/uL (ref 0.1–1.0)
NEUTROS ABS: 4.2 10*3/uL (ref 1.7–7.7)
Neutrophils Relative %: 46 %
Platelets: 374 10*3/uL (ref 150–400)
RBC: 4.76 MIL/uL (ref 3.87–5.11)
RDW: 12.5 % (ref 11.5–15.5)
WBC: 9.2 10*3/uL (ref 4.0–10.5)
nRBC: 0 % (ref 0.0–0.2)

## 2017-12-30 LAB — POC OCCULT BLOOD, ED: Fecal Occult Bld: NEGATIVE

## 2017-12-30 MED ORDER — LIDOCAINE VISCOUS HCL 2 % MT SOLN
15.0000 mL | Freq: Once | OROMUCOSAL | Status: AC
  Administered 2017-12-30: 15 mL via ORAL
  Filled 2017-12-30: qty 15

## 2017-12-30 MED ORDER — ALUM & MAG HYDROXIDE-SIMETH 200-200-20 MG/5ML PO SUSP
30.0000 mL | Freq: Once | ORAL | Status: AC
  Administered 2017-12-30: 30 mL via ORAL
  Filled 2017-12-30: qty 30

## 2017-12-30 NOTE — ED Triage Notes (Addendum)
Patient arrived POV.  -C/o trouble swallowing with acid reflux with burning, sweating, and pain (7/10).  Hx. Barrett's Esophagus- Pre cancer  -In the past they thought it was coming from a infection in her teeth.  -C/o vomiting 8x in past 48 hours and diarrhea 12x in past 24 hours.  -Patient states she has had diarrhea for months.

## 2017-12-30 NOTE — ED Notes (Signed)
Patient able to talk in complete sentences and drink water.

## 2017-12-30 NOTE — ED Provider Notes (Signed)
[facility] DEPT Provider Note   CSN: [id number] Arrival date & time: 12/30/17  1426     History   Chief Complaint Chief Complaint  Patient presents with  . trouble swallowing    burning, feeling like throat closing, choking  . Diarrhea    12x  . Emesis    8x    HPI Alexandra Harvey is a 62 y.o. female.  Patient presents to the emergency department with a chief complaint of sore throat.  She reports associated burning sensation in her throat.  Reports history of Barrett's esophagus.  She states that she feels like her throat is closing.  She reports having some voice changes.  She denies any fevers or chills.  She states that she has had some productive cough.  She also reports having some black stools.  She denies any chest pain or shortness of breath.  Denies any abdominal pain.  She has not been seen by GI in [place].  She does not have resources to get to a gastroenterologist.  She denies any other associated symptoms.  He has tried taking omeprazole with no relief.  The history is provided by the patient. No language interpreter was used.    Past Medical History:  Diagnosis Date  . Anxiety   . Chest pain    a. reports 3 nl stress tests in [place] over past 10 years.  . Depression   . DVT (deep venous thrombosis) ([place])    a. 2016 R DVT after knee surgery  tx w/ coumadin x 2 mos.  . Fibromyalgia   . GERD (gastroesophageal reflux disease)   . Hyperlipidemia   . Hypothyroidism   . Seasonal allergies   . Snoring    a. sometimes awakes @ night gasping.    There are no active problems to display for this patient.   Past Surgical History:  Procedure Laterality Date  . CARPAL TUNNEL RELEASE Right   . CERVICAL SPINE SURGERY    . CHOLECYSTECTOMY    . ELBOW SURGERY Right   . FOOT SURGERY Right   . KNEE ARTHROSCOPY Bilateral   . SHOULDER SURGERY Right   . TOTAL ABDOMINAL HYSTERECTOMY    . TOTAL KNEE ARTHROPLASTY Right      OB History     None      Home Medications    Prior to Admission medications   Medication Sig Start Date End Date Taking? Authorizing Provider  acetaminophen (TYLENOL) 500 MG tablet Take 500 mg by mouth every 6 (six) hours as needed for moderate pain.   Yes [provider]  B Complex Vitamins (VITAMIN B COMPLEX PO) Take 1 tablet by mouth daily.   Yes [provider]  baclofen (LIORESAL) 10 MG tablet Take 20 mg by mouth 2 (two) times daily.    Yes [provider]  Cetirizine HCl 10 MG CAPS Take 10 mg by mouth daily.   Yes [provider]  DULoxetine (CYMBALTA) 30 MG capsule Take 30 mg by mouth daily.    Yes [provider]  gabapentin (NEURONTIN) 800 MG tablet Take 800 mg by mouth 2 (two) times daily.  03/24/16  Yes [provider]  ibuprofen (ADVIL,MOTRIN) 200 MG tablet Take 200 mg by mouth every 6 (six) hours as needed for moderate pain.   Yes [provider]  lansoprazole (PREVACID) 30 MG capsule Take 30 mg by mouth daily at 12 noon.   Yes [provider]  levothyroxine (SYNTHROID, LEVOTHROID) 100 MCG tablet  Take 100 mcg by mouth daily before breakfast.   Yes [provider]  VITAMIN E PO Take 1 tablet by mouth daily.   Yes [provider]  lidocaine (XYLOCAINE) 2 % solution Use as directed 15 mLs in the mouth or throat as needed for mouth pain. Patient not taking: Reported on 12/23/2017 11/03/17   Lorayne Bender, PA-C    Family History Family History  Problem Relation Age of Onset  . Atrial fibrillation Mother     Social History Social History   Tobacco Use  . Smoking status: Current Every Day Smoker    Packs/day: 0.50    Years: 41.00    Pack years: 20.50  . Smokeless tobacco: Never Used  . Tobacco comment: 41 pack year h/o tobacco abuse, currently ~ 0.5 ppd.  Substance Use Topics  . Alcohol use: No  . Drug use: Yes    Types: Marijuana    Comment: 2 marijuana cigarettes/wk.     Allergies    Tetracyclines & related   Review of Systems Review of Systems  All other systems reviewed and are negative.    Physical Exam Updated Vital Signs BP (!) 155/89 (BP Location: Left Arm)   Pulse 84   Temp 98.2 F (36.8 C) (Oral)   Resp 14   SpO2 99%   Physical Exam  Constitutional: She is oriented to person, place, and time. She appears well-developed and well-nourished.  HENT:  Head: Normocephalic and atraumatic.  Oropharynx is clear, no visible abscess, some raspiness to voice (abnormal for her), uvula midline, airway intact, no stridor  Eyes: Pupils are equal, round, and reactive to light. Conjunctivae and EOM are normal.  Neck: Normal range of motion. Neck supple.  Cardiovascular: Normal rate and regular rhythm. Exam reveals no gallop and no friction rub.  No murmur heard. Pulmonary/Chest: Effort normal and breath sounds normal. No respiratory distress. She has no wheezes. She has no rales. She exhibits no tenderness.  Abdominal: Soft. Bowel sounds are normal. She exhibits no distension and no mass. There is no tenderness. There is no rebound and no guarding.  No focal abdominal tenderness, no RLQ tenderness or pain at McBurney's point, no RUQ tenderness or Murphy's sign, no left-sided abdominal tenderness, no fluid wave, or signs of peritonitis   Musculoskeletal: Normal range of motion. She exhibits no edema or tenderness.  Neurological: She is alert and oriented to person, place, and time.  Skin: Skin is warm and dry.  Psychiatric: She has a normal mood and affect. Her behavior is normal. Judgment and thought content normal.  Nursing note and vitals reviewed.    ED Treatments / Results  Labs (all labs ordered are listed, but only abnormal results are displayed) Labs Reviewed  CBC WITH DIFFERENTIAL/PLATELET - Abnormal; Notable for the following components:      Result Value   Lymphs Abs 4.1 (*)    All other components within normal limits  BASIC METABOLIC PANEL -  Abnormal; Notable for the following components:   Potassium 3.4 (*)    Glucose, Bld 104 (*)    All other components within normal limits  POC OCCULT BLOOD, ED    EKG None  Radiology Dg Chest 2 View  Result Date: 12/30/2017 CLINICAL DATA:  Cough EXAM: CHEST - 2 VIEW COMPARISON:  12/23/2017 FINDINGS: Mild cardiomegaly with chronic interstitial/peribronchial opacity. No pleural effusion, focal consolidation or pneumothorax. IMPRESSION: Cardiomegaly and mild interstitial opacity, possibly mild pulmonary edema. Electronically Signed   By: Cletus Gash.D.  On: 12/30/2017 22:40    Procedures Procedures (including critical care time)  Medications Ordered in ED Medications  alum & mag hydroxide-simeth (MAALOX/MYLANTA) 200-200-20 MG/5ML suspension 30 mL (30 mLs Oral Given 12/30/17 2219)    And  lidocaine (XYLOCAINE) 2 % viscous mouth solution 15 mL (15 mLs Oral Given 12/30/17 2219)     Initial Impression / Assessment and Plan / ED Course  I have reviewed the triage vital signs and the nursing notes.  Pertinent labs & imaging results that were available during my care of the patient were reviewed by me and considered in my medical decision making (see chart for details).     Patient with history of Barrett's esophagus and acid reflux.  Complaining of burning in throat.  Also complains of some productive cough.  Will treat with GI cocktail.  Laboratory work-up is thus far reassuring.  H&H stable.  Patient did complain of some black stools, but her Hemoccult test was negative.  I do believe that she will need follow-up from gastroenterology, however insurance seems to be a barrier.  I have placed a consult for case management to see if they are able to assist patient in getting a referral.  Regarding the patient's productive cough, chest x-ray does show a mild opacity, I discussed these results with the patient.  We will treat with azithromycin and recommend close follow-up for this.   Patient understands and agrees with plan.  She is stable and ready for discharge.  Final Clinical Impressions(s) / ED Diagnoses   Final diagnoses:  Cough  H/O gastroesophageal reflux (GERD)    ED Discharge Orders         Ordered    alum & mag hydroxide-simeth (MAALOX/MYLANTA) 200-200-20 MG/5ML suspension  Every 6 hours PRN     12/31/17 0032    azithromycin (ZITHROMAX) 250 MG tablet  Daily     12/31/17 0032           Montine Circle, PA-C 12/31/17 0131    Drenda Freeze, MD 12/31/17 1600

## 2017-12-30 NOTE — ED Notes (Signed)
Patient transported to X-ray 

## 2017-12-31 LAB — BASIC METABOLIC PANEL WITH GFR
Anion gap: 9 (ref 5–15)
BUN: 8 mg/dL (ref 6–20)
CO2: 28 mmol/L (ref 22–32)
Calcium: 9.7 mg/dL (ref 8.9–10.3)
Chloride: 102 mmol/L (ref 98–111)
Creatinine, Ser: 0.68 mg/dL (ref 0.44–1.00)
GFR calc Af Amer: >60 mL/min
GFR calc non Af Amer: >60 mL/min
Glucose, Bld: 104 mg/dL — ABNORMAL HIGH (ref 70–99)
Potassium: 3.4 mmol/L — ABNORMAL LOW (ref 3.5–5.1)
Sodium: 139 mmol/L (ref 135–145)

## 2017-12-31 MED ORDER — ALUM & MAG HYDROXIDE-SIMETH 200-200-20 MG/5ML PO SUSP: 15.0000 mL | Freq: Four times a day (QID) | ORAL | 0 refills | Status: DC | PRN

## 2017-12-31 MED ORDER — AZITHROMYCIN 250 MG PO TABS: 250.0000 mg | ORAL_TABLET | Freq: Every day | ORAL | 0 refills | Status: DC

## 2017-12-31 MED ORDER — AZITHROMYCIN 250 MG PO TABS
500.0000 mg | ORAL_TABLET | Freq: Once | ORAL | Status: AC
  Administered 2017-12-31: 500 mg via ORAL
  Filled 2017-12-31: qty 2

## 2017-12-31 NOTE — Care Management Note (Signed)
Case Management Note  CM consulted for insurance barriers to GI follow up.  Pt was provided follow up contact information for PCP and GI.  Pt has Medicare A and B so there should be no insurance barriers related to PCP or specialty follow up.  01/01/2018 15:27 CM left several messages for pt to return call with no return call.  CM will assist pt if she does ever call in.  Aldean Jewett, PA via messages.  Rivan Siordia, Benjaman Lobe, RN 12/31/2017, 9:58 AM

## 2017-12-31 NOTE — Discharge Instructions (Addendum)
Ask the case manager who will call you if you qualify for an orange card.

## 2018-01-09 DIAGNOSIS — Z90710 Acquired absence of both cervix and uterus: Secondary | ICD-10-CM | POA: Diagnosis not present

## 2018-01-09 DIAGNOSIS — Z79899 Other long term (current) drug therapy: Secondary | ICD-10-CM | POA: Diagnosis not present

## 2018-01-09 DIAGNOSIS — R079 Chest pain, unspecified: Secondary | ICD-10-CM | POA: Diagnosis not present

## 2018-01-09 DIAGNOSIS — R0789 Other chest pain: Secondary | ICD-10-CM | POA: Diagnosis not present

## 2018-01-09 DIAGNOSIS — R002 Palpitations: Secondary | ICD-10-CM | POA: Diagnosis not present

## 2018-01-09 DIAGNOSIS — M797 Fibromyalgia: Secondary | ICD-10-CM | POA: Diagnosis not present

## 2018-01-09 DIAGNOSIS — K219 Gastro-esophageal reflux disease without esophagitis: Secondary | ICD-10-CM | POA: Diagnosis not present

## 2018-01-09 DIAGNOSIS — Z9049 Acquired absence of other specified parts of digestive tract: Secondary | ICD-10-CM | POA: Diagnosis not present

## 2018-01-09 DIAGNOSIS — Z7982 Long term (current) use of aspirin: Secondary | ICD-10-CM | POA: Diagnosis not present

## 2018-01-09 DIAGNOSIS — Z720 Tobacco use: Secondary | ICD-10-CM | POA: Diagnosis not present

## 2018-01-09 DIAGNOSIS — Z880 Allergy status to penicillin: Secondary | ICD-10-CM | POA: Diagnosis not present

## 2018-01-09 DIAGNOSIS — R0602 Shortness of breath: Secondary | ICD-10-CM | POA: Diagnosis not present

## 2018-01-13 DIAGNOSIS — E785 Hyperlipidemia, unspecified: Secondary | ICD-10-CM | POA: Diagnosis not present

## 2018-01-13 DIAGNOSIS — M797 Fibromyalgia: Secondary | ICD-10-CM | POA: Diagnosis not present

## 2018-01-13 DIAGNOSIS — E039 Hypothyroidism, unspecified: Secondary | ICD-10-CM | POA: Diagnosis not present

## 2018-01-13 DIAGNOSIS — F322 Major depressive disorder, single episode, severe without psychotic features: Secondary | ICD-10-CM | POA: Diagnosis not present

## 2018-01-13 DIAGNOSIS — K219 Gastro-esophageal reflux disease without esophagitis: Secondary | ICD-10-CM | POA: Diagnosis not present

## 2018-01-13 DIAGNOSIS — F5101 Primary insomnia: Secondary | ICD-10-CM | POA: Diagnosis not present

## 2018-02-09 DIAGNOSIS — R197 Diarrhea, unspecified: Secondary | ICD-10-CM | POA: Diagnosis not present

## 2018-02-09 DIAGNOSIS — R1310 Dysphagia, unspecified: Secondary | ICD-10-CM | POA: Diagnosis not present

## 2018-02-09 DIAGNOSIS — K219 Gastro-esophageal reflux disease without esophagitis: Secondary | ICD-10-CM | POA: Diagnosis not present

## 2018-02-10 DIAGNOSIS — N898 Other specified noninflammatory disorders of vagina: Secondary | ICD-10-CM | POA: Diagnosis not present

## 2018-02-10 DIAGNOSIS — R1310 Dysphagia, unspecified: Secondary | ICD-10-CM | POA: Diagnosis not present

## 2018-02-10 DIAGNOSIS — B370 Candidal stomatitis: Secondary | ICD-10-CM | POA: Diagnosis not present

## 2018-02-10 DIAGNOSIS — F419 Anxiety disorder, unspecified: Secondary | ICD-10-CM | POA: Diagnosis not present

## 2018-02-19 DIAGNOSIS — D125 Benign neoplasm of sigmoid colon: Secondary | ICD-10-CM | POA: Diagnosis not present

## 2018-02-19 DIAGNOSIS — D122 Benign neoplasm of ascending colon: Secondary | ICD-10-CM | POA: Diagnosis not present

## 2018-02-19 DIAGNOSIS — K2900 Acute gastritis without bleeding: Secondary | ICD-10-CM | POA: Diagnosis not present

## 2018-02-19 DIAGNOSIS — K640 First degree hemorrhoids: Secondary | ICD-10-CM | POA: Diagnosis not present

## 2018-02-19 DIAGNOSIS — K228 Other specified diseases of esophagus: Secondary | ICD-10-CM | POA: Diagnosis not present

## 2018-02-19 DIAGNOSIS — D120 Benign neoplasm of cecum: Secondary | ICD-10-CM | POA: Diagnosis not present

## 2018-02-19 DIAGNOSIS — K31819 Angiodysplasia of stomach and duodenum without bleeding: Secondary | ICD-10-CM | POA: Diagnosis not present

## 2018-02-19 DIAGNOSIS — R12 Heartburn: Secondary | ICD-10-CM | POA: Diagnosis not present

## 2018-02-19 DIAGNOSIS — R1310 Dysphagia, unspecified: Secondary | ICD-10-CM | POA: Diagnosis not present

## 2018-02-19 DIAGNOSIS — R197 Diarrhea, unspecified: Secondary | ICD-10-CM | POA: Diagnosis not present

## 2018-02-19 DIAGNOSIS — K635 Polyp of colon: Secondary | ICD-10-CM | POA: Diagnosis not present

## 2018-02-19 DIAGNOSIS — K219 Gastro-esophageal reflux disease without esophagitis: Secondary | ICD-10-CM | POA: Diagnosis not present

## 2018-02-24 DIAGNOSIS — K219 Gastro-esophageal reflux disease without esophagitis: Secondary | ICD-10-CM | POA: Diagnosis not present

## 2018-02-24 DIAGNOSIS — D120 Benign neoplasm of cecum: Secondary | ICD-10-CM | POA: Diagnosis not present

## 2018-02-24 DIAGNOSIS — D125 Benign neoplasm of sigmoid colon: Secondary | ICD-10-CM | POA: Diagnosis not present

## 2018-02-24 DIAGNOSIS — K635 Polyp of colon: Secondary | ICD-10-CM | POA: Diagnosis not present

## 2018-02-24 DIAGNOSIS — D122 Benign neoplasm of ascending colon: Secondary | ICD-10-CM | POA: Diagnosis not present

## 2018-03-03 ENCOUNTER — Other Ambulatory Visit: Payer: Self-pay

## 2018-03-03 ENCOUNTER — Emergency Department (HOSPITAL_COMMUNITY)
Admission: EM | Admit: 2018-03-03 | Discharge: 2018-03-03 | Disposition: A | Discharge status: Home / Self Care | Payer: Medicare Other | Attending: Emergency Medicine | Admitting: Emergency Medicine

## 2018-03-03 ENCOUNTER — Emergency Department (HOSPITAL_COMMUNITY): Payer: Medicare Other

## 2018-03-03 ENCOUNTER — Encounter (HOSPITAL_COMMUNITY): Payer: Self-pay | Admitting: Emergency Medicine

## 2018-03-03 DIAGNOSIS — E039 Hypothyroidism, unspecified: Secondary | ICD-10-CM | POA: Diagnosis not present

## 2018-03-03 DIAGNOSIS — R000 Tachycardia, unspecified: Secondary | ICD-10-CM | POA: Diagnosis not present

## 2018-03-03 DIAGNOSIS — J189 Pneumonia, unspecified organism: Secondary | ICD-10-CM | POA: Diagnosis not present

## 2018-03-03 DIAGNOSIS — F17200 Nicotine dependence, unspecified, uncomplicated: Secondary | ICD-10-CM | POA: Diagnosis not present

## 2018-03-03 DIAGNOSIS — Z96651 Presence of right artificial knee joint: Secondary | ICD-10-CM | POA: Insufficient documentation

## 2018-03-03 DIAGNOSIS — Z79899 Other long term (current) drug therapy: Secondary | ICD-10-CM | POA: Diagnosis not present

## 2018-03-03 DIAGNOSIS — R079 Chest pain, unspecified: Secondary | ICD-10-CM | POA: Diagnosis not present

## 2018-03-03 DIAGNOSIS — Z720 Tobacco use: Secondary | ICD-10-CM

## 2018-03-03 LAB — CBC
HCT: 38.1 % (ref 36.0–46.0)
Hemoglobin: 12.2 g/dL (ref 12.0–15.0)
MCH: 29.5 pg (ref 26.0–34.0)
MCHC: 32 g/dL (ref 30.0–36.0)
MCV: 92.3 fL (ref 80.0–100.0)
PLATELETS: 328 10*3/uL (ref 150–400)
RBC: 4.13 MIL/uL (ref 3.87–5.11)
RDW: 13.2 % (ref 11.5–15.5)
WBC: 13.1 10*3/uL — ABNORMAL HIGH (ref 4.0–10.5)
nRBC: 0 % (ref 0.0–0.2)

## 2018-03-03 LAB — POCT I-STAT TROPONIN I: Troponin i, poc: 0 ng/mL (ref 0.00–0.08)

## 2018-03-03 LAB — BASIC METABOLIC PANEL
Anion gap: 10 (ref 5–15)
BUN: 10 mg/dL (ref 8–23)
CALCIUM: 9.3 mg/dL (ref 8.9–10.3)
CO2: 24 mmol/L (ref 22–32)
CREATININE: 0.71 mg/dL (ref 0.44–1.00)
Chloride: 105 mmol/L (ref 98–111)
GFR calc Af Amer: >60 mL/min (ref >60)
GLUCOSE: 112 mg/dL — AB (ref 70–99)
Potassium: 3.4 mmol/L — ABNORMAL LOW (ref 3.5–5.1)
Sodium: 139 mmol/L (ref 135–145)

## 2018-03-03 MED ORDER — ALBUTEROL SULFATE 108 (90 BASE) MCG/ACT IN AEPB: 2.0000 | INHALATION_SPRAY | RESPIRATORY_TRACT | 0 refills | Status: DC | PRN

## 2018-03-03 MED ORDER — AZITHROMYCIN 250 MG PO TABS: 250.0000 mg | ORAL_TABLET | Freq: Every day | ORAL | 0 refills | Status: DC

## 2018-03-03 MED ORDER — SODIUM CHLORIDE 0.9 % IV SOLN
1.0000 g | Freq: Once | INTRAVENOUS | Status: AC
  Administered 2018-03-03: 1 g via INTRAVENOUS
  Filled 2018-03-03: qty 10

## 2018-03-03 NOTE — Discharge Instructions (Addendum)
Contact a health care provider if: °You have a fever. °You are losing sleep because you cannot control your cough with cough medicine. °Get help right away if: °You have worsening shortness of breath. °You have increased chest pain. °Your sickness becomes worse, especially if you are an older adult or have a weakened immune system. °You cough up blood. °

## 2018-03-03 NOTE — ED Provider Notes (Signed)
[facility] DEPT Provider Note   CSN: [id number] Arrival date & time: 03/03/18  0006     History   Chief Complaint Chief Complaint  Patient presents with  . Palpitations    HPI Alexandra Harvey is a 62 y.o. female.  Who presents emergency department chief complaint of racing heart.  Patient states that today at around 11 PM she felt her heart began to race and palpitate.  She had associated mild chest pain and felt short of breath.  She said it lasted about an hour and 1/2 to 2 hours and resolved while she was waiting to be seen in the emergency department.  She has had a recent upper respiratory infection and subjective fever at home.  She is a daily smoker.  She denies hemoptysis but has productive cough.  She has no other associated symptoms.  HPI  Past Medical History:  Diagnosis Date  . Anxiety   . Chest pain    a. reports 3 nl stress tests in [place] over past 10 years.  . Depression   . DVT (deep venous thrombosis) ([place])    a. 2016 R DVT after knee surgery  tx w/ coumadin x 2 mos.  . Fibromyalgia   . GERD (gastroesophageal reflux disease)   . Hyperlipidemia   . Hypothyroidism   . Seasonal allergies   . Snoring    a. sometimes awakes @ night gasping.    There are no active problems to display for this patient.   Past Surgical History:  Procedure Laterality Date  . CARPAL TUNNEL RELEASE Right   . CERVICAL SPINE SURGERY    . CHOLECYSTECTOMY    . ELBOW SURGERY Right   . FOOT SURGERY Right   . KNEE ARTHROSCOPY Bilateral   . SHOULDER SURGERY Right   . TOTAL ABDOMINAL HYSTERECTOMY    . TOTAL KNEE ARTHROPLASTY Right      OB History   No obstetric history on file.      Home Medications    Prior to Admission medications   Medication Sig Start Date End Date Taking? Authorizing Provider  acetaminophen (TYLENOL) 500 MG tablet Take 1,000 mg by mouth every 6 (six) hours as needed for moderate pain.    Yes [provider]    amitriptyline (ELAVIL) 25 MG tablet Take 25 mg by mouth at bedtime.   Yes [provider]  baclofen (LIORESAL) 10 MG tablet Take 20 mg by mouth 2 (two) times daily.    Yes [provider]  busPIRone (BUSPAR) 10 MG tablet Take 10 mg by mouth 2 (two) times daily.   Yes [provider]  Cholecalciferol (DIALYVITE VITAMIN D 5000 PO) Take 5,000 Units by mouth daily.   Yes [provider]  DULoxetine (CYMBALTA) 60 MG capsule Take 60 mg by mouth daily.   Yes [provider]  ibuprofen (ADVIL,MOTRIN) 200 MG tablet Take 400 mg by mouth every 6 (six) hours as needed for moderate pain.    Yes [provider]  lansoprazole (PREVACID) 30 MG capsule Take 30 mg by mouth daily at 12 noon.   Yes [provider]  levothyroxine (SYNTHROID, LEVOTHROID) 100 MCG tablet Take 100 mcg by mouth daily before breakfast.   Yes [provider]  nystatin (MYCOSTATIN) 100000 UNIT/ML suspension Take 4 mLs by mouth 4 (four) times daily.   Yes [provider]  pravastatin (PRAVACHOL) 40 MG tablet Take 40 mg by mouth daily.   Yes [provider]  Propylene Glycol (  SYSTANE BALANCE) 0.6 % SOLN Place 1 drop into both eyes 3 (three) times daily as needed (rash).   Yes [provider]  traZODone (DESYREL) 100 MG tablet Take 100 mg by mouth at bedtime.   Yes [provider]  vitamin B-12 (CYANOCOBALAMIN) 1000 MCG tablet Take 1,000 mcg by mouth daily.   Yes [provider]  alum & mag hydroxide-simeth (MAALOX/MYLANTA) 200-200-20 MG/5ML suspension Take 15 mLs by mouth every 6 (six) hours as needed for indigestion or heartburn. Patient not taking: Reported on 03/03/2018 12/31/17   Montine Circle, PA-C  azithromycin (ZITHROMAX) 250 MG tablet Take 1 tablet (250 mg total) by mouth daily. Take first 2 tablets together, then 1 every day until finished. Patient not taking: Reported on 03/03/2018 12/31/17   Montine Circle, PA-C   lidocaine (XYLOCAINE) 2 % solution Use as directed 15 mLs in the mouth or throat as needed for mouth pain. Patient not taking: Reported on 12/23/2017 11/03/17   Lorayne Bender, PA-C    Family History Family History  Problem Relation Age of Onset  . Atrial fibrillation Mother     Social History Social History   Tobacco Use  . Smoking status: Current Every Day Smoker    Packs/day: 0.50    Years: 41.00    Pack years: 20.50  . Smokeless tobacco: Never Used  . Tobacco comment: 41 pack year h/o tobacco abuse, currently ~ 0.5 ppd.  Substance Use Topics  . Alcohol use: No  . Drug use: Yes    Types: Marijuana    Comment: 2 marijuana cigarettes/wk.     Allergies   Tetracyclines & related   Review of Systems Review of Systems Ten systems reviewed and are negative for acute change, except as noted in the HPI.    Physical Exam Updated Vital Signs BP 117/67 (BP Location: Left Arm)   Pulse 98   Temp 97.7 F (36.5 C) (Oral)   Resp 16   Ht 5\' 7"  (1.702 m)   Wt 81.6 kg   SpO2 96%   BMI 28.19 kg/m   Physical Exam Vitals signs and nursing note reviewed.  Constitutional:      General: She is not in acute distress.    Appearance: She is well-developed. She is not diaphoretic.  HENT:     Head: Normocephalic and atraumatic.  Eyes:     General: No scleral icterus.    Conjunctiva/sclera: Conjunctivae normal.  Neck:     Musculoskeletal: Normal range of motion.  Cardiovascular:     Rate and Rhythm: Normal rate and regular rhythm.     Heart sounds: Normal heart sounds. No murmur. No friction rub. No gallop.   Pulmonary:     Effort: Pulmonary effort is normal. No respiratory distress.     Breath sounds: Wheezing present.  Abdominal:     General: Bowel sounds are normal. There is no distension.     Palpations: Abdomen is soft. There is no mass.     Tenderness: There is no abdominal tenderness. There is no guarding.  Skin:    General: Skin is warm and dry.  Neurological:      Mental Status: She is alert and oriented to person, place, and time.  Psychiatric:        Behavior: Behavior normal.      ED Treatments / Results  Labs (all labs ordered are listed, but only abnormal results are displayed) Labs Reviewed  BASIC METABOLIC PANEL - Abnormal; Notable for the following components:  Result Value   Potassium 3.4 (*)    Glucose, Bld 112 (*)    All other components within normal limits  CBC - Abnormal; Notable for the following components:   WBC 13.1 (*)    All other components within normal limits  I-STAT TROPONIN, ED  POCT I-STAT TROPONIN I    EKG EKG Interpretation  Date/Time:  Tuesday March 03 2018 00:28:55 EST Ventricular Rate:  96 PR Interval:    QRS Duration: 105 QT Interval:  376 QTC Calculation: 476 R Axis:   71 Text Interpretation:  Sinus rhythm RSR' in V1 or V2, right VCD or RVH Borderline T abnormalities, anterior leads No significant change was found Confirmed by Shanon Rosser [phone]) on 03/03/2018 12:32:17 AM   Radiology Dg Chest 2 View  Result Date: 03/03/2018 CLINICAL DATA:  Acute onset of mid chest pain and nausea. EXAM: CHEST - 2 VIEW COMPARISON:  Chest radiograph performed 12/30/2017 FINDINGS: The lungs are well-aerated. Patchy left basilar airspace opacity is concerning for pneumonia. Mild vascular congestion is noted. There is no evidence of pleural effusion or pneumothorax. The heart is borderline normal in size. Cervical spinal fusion hardware is partially imaged. No acute osseous abnormalities are seen. IMPRESSION: Patchy left basilar airspace opacity is concerning for pneumonia. Mild vascular congestion noted. Electronically Signed   By: Garald Balding M.D.   On: 03/03/2018 00:50    Procedures Procedures (including critical care time)  Medications Ordered in ED Medications - No data to display   Initial Impression / Assessment and Plan / ED Course  I have reviewed the triage vital signs and the nursing  notes.  Pertinent labs & imaging results that were available during my care of the patient were reviewed by me and considered in my medical decision making (see chart for details).     62 year old female who presents the emergency department with racing heart.  Her EKG shows normal sinus rhythm without any signs of ischemia.  Chest x-ray does show pulmonary infiltrates consistent with community-acquired pneumonia.  Differential for racing heart includes various tach arrhythmias and in the setting of infection the possibility of paroxysmal atrial fibrillation.  She has no documented tachycardia arrhythmias here in the emergency department.  Patient is advised to follow-up with her PCP or cardiology for Holter monitoring.  She was given Rocephin and azithromycin at discharge along with albuterol and a Hailer for wheezing.The patient was counseled on the dangers of tobacco use, and was advised to quit. Reviewed strategies to maximize success, including removing cigarettes and smoking materials from environment, stress management, substitution of other forms of reinforcement, support of family/friends and written materials.  Patient appears appropriate for discharge at this time   Final Clinical Impressions(s) / ED Diagnoses   Final diagnoses:  Community acquired pneumonia, unspecified laterality  Racing heart beat  Tobacco abuse    ED Discharge Orders    None       Margarita Mail, PA-C 03/03/18 0346    Shanon Rosser, MD 03/03/18 [phone]

## 2018-03-05 DIAGNOSIS — R899 Unspecified abnormal finding in specimens from other organs, systems and tissues: Secondary | ICD-10-CM | POA: Diagnosis not present

## 2018-04-21 ENCOUNTER — Encounter (HOSPITAL_COMMUNITY): Payer: Self-pay | Admitting: Emergency Medicine

## 2018-04-21 ENCOUNTER — Emergency Department (HOSPITAL_COMMUNITY): Payer: Medicare Other

## 2018-04-21 ENCOUNTER — Emergency Department (HOSPITAL_COMMUNITY)
Admission: EM | Admit: 2018-04-21 | Discharge: 2018-04-21 | Disposition: A | Discharge status: Home / Self Care | Payer: Medicare Other | Attending: Emergency Medicine | Admitting: Emergency Medicine

## 2018-04-21 DIAGNOSIS — F17210 Nicotine dependence, cigarettes, uncomplicated: Secondary | ICD-10-CM | POA: Diagnosis not present

## 2018-04-21 DIAGNOSIS — R0602 Shortness of breath: Secondary | ICD-10-CM

## 2018-04-21 DIAGNOSIS — Z79899 Other long term (current) drug therapy: Secondary | ICD-10-CM | POA: Insufficient documentation

## 2018-04-21 DIAGNOSIS — R079 Chest pain, unspecified: Secondary | ICD-10-CM | POA: Insufficient documentation

## 2018-04-21 DIAGNOSIS — E039 Hypothyroidism, unspecified: Secondary | ICD-10-CM | POA: Diagnosis not present

## 2018-04-21 DIAGNOSIS — J810 Acute pulmonary edema: Secondary | ICD-10-CM

## 2018-04-21 DIAGNOSIS — R531 Weakness: Secondary | ICD-10-CM | POA: Diagnosis not present

## 2018-04-21 DIAGNOSIS — R600 Localized edema: Secondary | ICD-10-CM | POA: Diagnosis not present

## 2018-04-21 DIAGNOSIS — R05 Cough: Secondary | ICD-10-CM | POA: Diagnosis not present

## 2018-04-21 LAB — URINALYSIS, ROUTINE W REFLEX MICROSCOPIC
Bilirubin Urine: NEGATIVE
Glucose, UA: NEGATIVE mg/dL
Hgb urine dipstick: NEGATIVE
Ketones, ur: NEGATIVE mg/dL
Leukocytes,Ua: NEGATIVE
Nitrite: NEGATIVE
Protein, ur: NEGATIVE mg/dL
Specific Gravity, Urine: 1.002 — ABNORMAL LOW (ref 1.005–1.030)
pH: 6 (ref 5.0–8.0)

## 2018-04-21 LAB — HEPATIC FUNCTION PANEL
ALT: 26 U/L (ref 0–44)
AST: 24 U/L (ref 15–41)
Albumin: 4.5 g/dL (ref 3.5–5.0)
Alkaline Phosphatase: 61 U/L (ref 38–126)
Bilirubin, Direct: <0.1 mg/dL (ref 0.0–0.2)
Total Bilirubin: 0.4 mg/dL (ref 0.3–1.2)
Total Protein: 7.2 g/dL (ref 6.5–8.1)

## 2018-04-21 LAB — CBC
HEMATOCRIT: 41.3 % (ref 36.0–46.0)
HEMOGLOBIN: 13.3 g/dL (ref 12.0–15.0)
MCH: 29 pg (ref 26.0–34.0)
MCHC: 32.2 g/dL (ref 30.0–36.0)
MCV: 90 fL (ref 80.0–100.0)
Platelets: 354 10*3/uL (ref 150–400)
RBC: 4.59 MIL/uL (ref 3.87–5.11)
RDW: 13.1 % (ref 11.5–15.5)
WBC: 8.5 10*3/uL (ref 4.0–10.5)
nRBC: 0 % (ref 0.0–0.2)

## 2018-04-21 LAB — BASIC METABOLIC PANEL
Anion gap: 8 (ref 5–15)
BUN: 6 mg/dL — ABNORMAL LOW (ref 8–23)
CO2: 25 mmol/L (ref 22–32)
Calcium: 8.7 mg/dL — ABNORMAL LOW (ref 8.9–10.3)
Chloride: 107 mmol/L (ref 98–111)
Creatinine, Ser: 0.76 mg/dL (ref 0.44–1.00)
GFR calc Af Amer: >60 mL/min (ref >60)
GFR calc non Af Amer: >60 mL/min (ref >60)
Glucose, Bld: 70 mg/dL (ref 70–99)
POTASSIUM: 4.2 mmol/L (ref 3.5–5.1)
Sodium: 140 mmol/L (ref 135–145)

## 2018-04-21 LAB — BRAIN NATRIURETIC PEPTIDE: B Natriuretic Peptide: 34.2 pg/mL (ref 0.0–100.0)

## 2018-04-21 LAB — I-STAT TROPONIN, ED: Troponin i, poc: 0 ng/mL (ref 0.00–0.08)

## 2018-04-21 LAB — TROPONIN I: Troponin I: <0.03 ng/mL (ref <0.03)

## 2018-04-21 MED ORDER — FUROSEMIDE 20 MG PO TABS: 20.0000 mg | ORAL_TABLET | Freq: Every day | ORAL | 0 refills | Status: DC

## 2018-04-21 MED ORDER — FUROSEMIDE 40 MG PO TABS
40.0000 mg | ORAL_TABLET | Freq: Once | ORAL | Status: AC
  Administered 2018-04-21: 40 mg via ORAL
  Filled 2018-04-21: qty 1

## 2018-04-21 MED ORDER — POTASSIUM CHLORIDE CRYS ER 20 MEQ PO TBCR: 20.0000 meq | EXTENDED_RELEASE_TABLET | Freq: Every day | ORAL | 0 refills | Status: DC

## 2018-04-21 MED ORDER — NITROGLYCERIN 2 % TD OINT
1.0000 [in_us] | TOPICAL_OINTMENT | Freq: Once | TRANSDERMAL | Status: AC
  Administered 2018-04-21: 1 [in_us] via TOPICAL
  Filled 2018-04-21: qty 1

## 2018-04-21 MED ORDER — FUROSEMIDE 10 MG/ML IJ SOLN
40.0000 mg | Freq: Once | INTRAMUSCULAR | Status: DC
  Filled 2018-04-21: qty 4

## 2018-04-21 NOTE — ED Notes (Signed)
Patient given discharge teaching and verbalized understanding. Patient ambulated out of ED with a steady gait. 

## 2018-04-21 NOTE — ED Notes (Signed)
Pt demanding to use the bathroom before EKG or rest of triage can be completed.

## 2018-04-21 NOTE — ED Provider Notes (Signed)
[facility] DEPT Provider Note   CSN: [id number] Arrival date & time: 04/21/18  0806    History   Chief Complaint Chief Complaint  Patient presents with  . Cough  . Urinary Frequency  . Chest Pain    HPI Alexandra Harvey is a 63 y.o. female.     HPI   63 yo F with PMHx as below here with multiple complaints.  Patient states that for the last 4 weeks, she separates worsening shortness of breath with exertion.  She is also noticed increased swelling in her bilateral legs.  She has been seen multiple times by her PCP for this, and was reportedly recently hospitalized within the last month at [facility].  She had a stress test which was negative at that time and she was sent home.  States that since then, she had persistent mild orthopnea, leg swelling, and shortness of breath with exertion.  She does not recall if she had an echocardiogram during her admission.  She also has some intermittent dull, left-sided chest pain which is the same pain for which she underwent stress test recently.  Denies history of coronary disease.  She states that she is also concerned about multiple things including chronic nasal congestion which is been going on for several years, possible mold exposure in her home, and nausea that has persisted for years.  No overt abdominal pain.  No fevers or chills.  These are all fairly chronic issues.  Regarding shortness of breath, it has been ongoing for at least the last 9 months.  Past Medical History:  Diagnosis Date  . Anxiety   . Chest pain    a. reports 3 nl stress tests in [place] over past 10 years.  . Depression   . DVT (deep venous thrombosis) ([place])    a. 2016 R DVT after knee surgery  tx w/ coumadin x 2 mos.  . Fibromyalgia   . GERD (gastroesophageal reflux disease)   . Hyperlipidemia   . Hypothyroidism   . Seasonal allergies   . Snoring    a. sometimes awakes @ night gasping.    There are no active problems to  display for this patient.   Past Surgical History:  Procedure Laterality Date  . CARPAL TUNNEL RELEASE Right   . CERVICAL SPINE SURGERY    . CHOLECYSTECTOMY    . ELBOW SURGERY Right   . FOOT SURGERY Right   . KNEE ARTHROSCOPY Bilateral   . SHOULDER SURGERY Right   . TOTAL ABDOMINAL HYSTERECTOMY    . TOTAL KNEE ARTHROPLASTY Right      OB History   No obstetric history on file.      Home Medications    Prior to Admission medications   Medication Sig Start Date End Date Taking? Authorizing Provider  amitriptyline (ELAVIL) 25 MG tablet Take 25 mg by mouth at bedtime.   Yes [provider]  baclofen (LIORESAL) 10 MG tablet Take 20 mg by mouth 3 (three) times daily.    Yes [provider]  Camphor-Menthol-Methyl Sal (TIGER BALM MUSCLE RUB EX) Apply 1 application topically 2 (two) times daily as needed (muscle pain).   Yes [provider]  Cholecalciferol (DIALYVITE VITAMIN D 5000 PO) Take 5,000 Units by mouth daily.   Yes [provider]  DULoxetine (CYMBALTA) 60 MG capsule Take 60 mg by mouth daily.   Yes [provider]  ibuprofen (ADVIL,MOTRIN) 200 MG tablet Take 400 mg by mouth every 6 (six)  hours as needed for moderate pain.    Yes [provider]  lansoprazole (PREVACID) 30 MG capsule Take 30 mg by mouth daily at 12 noon.   Yes [provider]  levothyroxine (SYNTHROID, LEVOTHROID) 100 MCG tablet Take 100 mcg by mouth daily before breakfast.   Yes [provider]  pravastatin (PRAVACHOL) 40 MG tablet Take 40 mg by mouth daily.   Yes [provider]  traZODone (DESYREL) 100 MG tablet Take 100 mg by mouth at bedtime.   Yes [provider]  vitamin B-12 (CYANOCOBALAMIN) 1000 MCG tablet Take 1,000 mcg by mouth daily.   Yes [provider]  Albuterol Sulfate (PROAIR RESPICLICK) 644 (90 Base) MCG/ACT AEPB Inhale 2 puffs into the lungs every 4 (four) hours as needed (cough and  wheezing). Patient not taking: Reported on 04/21/2018 03/03/18   Margarita Mail, PA-C  alum & mag hydroxide-simeth (MAALOX/MYLANTA) 200-200-20 MG/5ML suspension Take 15 mLs by mouth every 6 (six) hours as needed for indigestion or heartburn. Patient not taking: Reported on 03/03/2018 12/31/17   Montine Circle, PA-C  azithromycin (ZITHROMAX) 250 MG tablet Take 1 tablet (250 mg total) by mouth daily. Take first 2 tablets together, then 1 every day until finished. Patient not taking: Reported on 04/21/2018 03/03/18   Margarita Mail, PA-C  furosemide (LASIX) 20 MG tablet Take 1 tablet (20 mg total) by mouth daily for 3 days. 04/21/18 04/24/18  Duffy Bruce, MD  lidocaine (XYLOCAINE) 2 % solution Use as directed 15 mLs in the mouth or throat as needed for mouth pain. Patient not taking: Reported on 12/23/2017 11/03/17   Joy, Raquel Sarna C, PA-C  potassium chloride SA (K-DUR,KLOR-CON) 20 MEQ tablet Take 1 tablet (20 mEq total) by mouth daily for 3 days. 04/21/18 04/24/18  Duffy Bruce, MD    Family History Family History  Problem Relation Age of Onset  . Atrial fibrillation Mother     Social History Social History   Tobacco Use  . Smoking status: Current Every Day Smoker    Packs/day: 0.50    Years: 41.00    Pack years: 20.50  . Smokeless tobacco: Never Used  . Tobacco comment: 41 pack year h/o tobacco abuse, currently ~ 0.5 ppd.  Substance Use Topics  . Alcohol use: No  . Drug use: Yes    Types: Marijuana    Comment: 2 marijuana cigarettes/wk.     Allergies   Tetracyclines & related   Review of Systems Review of Systems  Constitutional: Positive for fatigue. Negative for chills and fever.  HENT: Negative for congestion and rhinorrhea.   Eyes: Negative for visual disturbance.  Respiratory: Positive for cough and shortness of breath. Negative for wheezing.   Cardiovascular: Positive for leg swelling. Negative for chest pain.  Gastrointestinal: Negative for abdominal pain,  diarrhea, nausea and vomiting.  Genitourinary: Negative for dysuria and flank pain.  Musculoskeletal: Negative for neck pain and neck stiffness.  Skin: Negative for rash and wound.  Allergic/Immunologic: Negative for immunocompromised state.  Neurological: Positive for weakness. Negative for syncope and headaches.  All other systems reviewed and are negative.    Physical Exam Updated Vital Signs BP 113/76 (BP Location: Left Arm)   Pulse 83   Resp 16   SpO2 98%   Physical Exam Vitals signs and nursing note reviewed.  Constitutional:      General: She is not in acute distress.    Appearance: She is well-developed.  HENT:     Head: Normocephalic and atraumatic.  Eyes:  Conjunctiva/sclera: Conjunctivae normal.  Neck:     Musculoskeletal: Neck supple.  Cardiovascular:     Rate and Rhythm: Normal rate and regular rhythm.     Heart sounds: Normal heart sounds. No murmur. No friction rub.  Pulmonary:     Effort: Pulmonary effort is normal. No respiratory distress.     Breath sounds: Examination of the right-lower field reveals rales. Examination of the left-lower field reveals rales. Rales present. No wheezing.  Abdominal:     General: There is no distension.     Palpations: Abdomen is soft.     Tenderness: There is no abdominal tenderness.  Musculoskeletal:     Right lower leg: Edema present.     Left lower leg: Edema present.  Skin:    General: Skin is warm.     Capillary Refill: Capillary refill takes less than 2 seconds.  Neurological:     Mental Status: She is alert and oriented to person, place, and time.     Motor: No abnormal muscle tone.      ED Treatments / Results  Labs (all labs ordered are listed, but only abnormal results are displayed) Labs Reviewed  BASIC METABOLIC PANEL - Abnormal; Notable for the following components:      Result Value   BUN 6 (*)    Calcium 8.7 (*)    All other components within normal limits  URINALYSIS, ROUTINE W REFLEX  MICROSCOPIC - Abnormal; Notable for the following components:   Color, Urine STRAW (*)    APPearance HAZY (*)    Specific Gravity, Urine 1.002 (*)    All other components within normal limits  CBC  TROPONIN I  BRAIN NATRIURETIC PEPTIDE  HEPATIC FUNCTION PANEL  TROPONIN I  I-STAT TROPONIN, ED    EKG Normal sinus rhythm, VR 83. RSR' pattern which has been previously noted. No ST-T changes, no elevation or depression when compared to 03/03/2018. Normal intervals.  Radiology Dg Chest 2 View  Result Date: 04/21/2018 CLINICAL DATA:  Cough.  Chest pain. EXAM: CHEST - 2 VIEW COMPARISON:  03/03/2018. FINDINGS: Mediastinum hilar structures normal. Mild cardiomegaly. Heart size stable. Mild bilateral interstitial prominence. Mild edema and/or pneumonitis could present this fashion. Previous identified left base infiltrate has cleared. No pleural effusion or pneumothorax. Prior cervicothoracic spine fusion. IMPRESSION: 1. Mild bilateral pulmonary interstitial prominence. Mild edema is and or pneumonitis could present this fashion. Previously identified left base infiltrate has cleared. 2.  Mild cardiomegaly.  Heart size stable. Electronically Signed   By: Marcello Moores  Register   On: 04/21/2018 08:57    Procedures Procedures (including critical care time)  Medications Ordered in ED Medications  nitroGLYCERIN (NITROGLYN) 2 % ointment 1 inch (1 inch Topical Given 04/21/18 1110)  furosemide (LASIX) tablet 40 mg (40 mg Oral Given 04/21/18 1202)     Initial Impression / Assessment and Plan / ED Course  I have reviewed the triage vital signs and the nursing notes.  Pertinent labs & imaging results that were available during my care of the patient were reviewed by me and considered in my medical decision making (see chart for details).        62 year old female here with symptoms consistent with possible mild right-sided congestive heart failure given her obesity, suspected history of sleep apnea, and  edema.  I suspect her shortness of breath is multifactorial secondary to this as well as possible deconditioning.  She may also have a component of possible mild reactive pneumonitis based on her chest x-ray.  Lab work today is overall very reassuring.  Her EKG is nonischemic with troponins negative x2 and recent negative stress test within the last month, making ACS unlikely.  She has had a negative PE work-up as well.  She has no unilateral leg swelling to suggest clot.  She feels markedly improved with Lasix here.  Given her otherwise well appearance, I think it is reasonable for her to follow-up with her PCP for an echocardiogram and further referral to pulmonologist as needed.  Discussed risks and benefits of treating her edema empirically with Lasix, and she would like to attempt 3-day treatment.  She is advised to continue hydration, as well as take potassium with this.  She will follow-up with her PCP this week.  Final Clinical Impressions(s) / ED Diagnoses   Final diagnoses:  Exertional shortness of breath  Acute pulmonary edema [facility])    ED Discharge Orders         Ordered    furosemide (LASIX) 20 MG tablet  Daily     04/21/18 1509    potassium chloride SA (K-DUR,KLOR-CON) 20 MEQ tablet  Daily     04/21/18 1509           Duffy Bruce, MD 04/21/18 2023

## 2018-04-21 NOTE — Discharge Instructions (Signed)
As we discussed,   It is reasonable to continue lasix for 3 days to see if it helps with your symptoms.   Take the potassium while on Lasix.  You should call your doctor to discuss your symptoms - the differential includes right-sided heart failure from sleep apnea, or potentially a reactive airway issue related to mold/allergy exposure  I'd recommend considering an echocardiogram or even possibly a Pulmonology (lung) evaluation

## 2018-04-21 NOTE — ED Triage Notes (Signed)
When calling patient from lobby pt eating a banana.  Pt reports for months she has had cough, urinary frequency, chest pains and left arm pains, dizziness. Pt reports that she thinks her thrush is back and she probably has PNA.

## 2018-04-23 ENCOUNTER — Other Ambulatory Visit (HOSPITAL_COMMUNITY): Payer: Self-pay | Admitting: Family Medicine

## 2018-04-23 ENCOUNTER — Other Ambulatory Visit: Payer: Self-pay | Admitting: Family Medicine

## 2018-04-23 DIAGNOSIS — R0602 Shortness of breath: Secondary | ICD-10-CM | POA: Diagnosis not present

## 2018-04-23 DIAGNOSIS — I517 Cardiomegaly: Secondary | ICD-10-CM | POA: Diagnosis not present

## 2018-04-23 DIAGNOSIS — R0683 Snoring: Secondary | ICD-10-CM | POA: Diagnosis not present

## 2018-04-23 DIAGNOSIS — J810 Acute pulmonary edema: Secondary | ICD-10-CM | POA: Diagnosis not present

## 2018-04-23 DIAGNOSIS — B370 Candidal stomatitis: Secondary | ICD-10-CM | POA: Diagnosis not present

## 2018-04-23 DIAGNOSIS — G479 Sleep disorder, unspecified: Secondary | ICD-10-CM | POA: Diagnosis not present

## 2018-04-24 ENCOUNTER — Ambulatory Visit (HOSPITAL_COMMUNITY): Payer: Medicare Other | Attending: Cardiovascular Disease

## 2018-04-24 DIAGNOSIS — I517 Cardiomegaly: Secondary | ICD-10-CM | POA: Diagnosis not present

## 2018-04-27 ENCOUNTER — Institutional Professional Consult (permissible substitution): Payer: Medicare Other | Admitting: Pulmonary Disease

## 2018-05-04 ENCOUNTER — Encounter: Payer: Self-pay | Admitting: Internal Medicine

## 2018-05-04 ENCOUNTER — Ambulatory Visit (INDEPENDENT_AMBULATORY_CARE_PROVIDER_SITE_OTHER): Payer: Medicare Other | Admitting: Internal Medicine

## 2018-05-04 VITALS — BP 124/78 | HR 109 | Ht 67.0 in | Wt 211.2 lb

## 2018-05-04 DIAGNOSIS — F17210 Nicotine dependence, cigarettes, uncomplicated: Secondary | ICD-10-CM | POA: Diagnosis not present

## 2018-05-04 DIAGNOSIS — R0609 Other forms of dyspnea: Secondary | ICD-10-CM | POA: Diagnosis not present

## 2018-05-04 DIAGNOSIS — E669 Obesity, unspecified: Secondary | ICD-10-CM

## 2018-05-04 DIAGNOSIS — R0600 Dyspnea, unspecified: Secondary | ICD-10-CM | POA: Insufficient documentation

## 2018-05-04 MED ORDER — LANSOPRAZOLE 30 MG PO CPDR: DELAYED_RELEASE_CAPSULE | ORAL | 2 refills | Status: DC

## 2018-05-04 NOTE — Progress Notes (Signed)
Alexandra Harvey, female    DOB: 01-23-1956,    MRN: [id number]   Brief patient profile:  61  yowf active smoker with no problem with term IUP last in 1984 with baseline wt 130-40 lb with R Knee THR 2016 slowed her down/ complicated by R DVT and came to [place] shortly thereafter  and could exercise but never did go back to the same level of aeorbic acitivity with progressive doe/wt gain esp since summer 2018 with assoc palpitation, sweating , chest should and back tightness, with "neg cardiac eval" per pt but did not include any exertion ("just the chemical one") [place] out of state and had nl echo here so referred to pulmonary clinic 05/04/2018 by Jillyn Ledger for ? Copd evolving?     History of Present Illness  05/04/2018  Pulmonary/ 1st office eval/Britany Callicott  Chief Complaint  Patient presents with  . Pulmonary Consult    Referred by Jillyn Ledger, FNP.  Pt c/o DOE x 8 months. She states she gets winded just walking room to room.   Dyspnea:  Room to room  Cough: sense of pnds but no actual mucus with gag/vomit sev times a months and episodes where can't speak at all  Sleep: electric bed at 30 degrees due to gerd  SABA use: albuterol did not correct it  ppi not ac   No obvious day to day or daytime variability or assoc excess/ purulent sputum or mucus plugs or hemoptysis or cp or chest tightness, subjective wheeze or overt sinus or hb symptoms.   Sleeping as above  without nocturnal  or early am exacerbation  of respiratory  c/o's or need for noct saba. Also denies any obvious fluctuation of symptoms with weather or environmental changes or other aggravating or alleviating factors except as outlined above   No unusual exposure hx or h/o childhood pna/ asthma or knowledge of premature birth.  Current Allergies, Complete Past Medical History, Past Surgical History, Family History, and Social History were reviewed in [employer] record.  ROS  The following are not active complaints  unless bolded Hoarseness, sore throat, dysphagia, dental problems, itching, sneezing,  nasal congestion or discharge of excess mucus or purulent secretions, ear ache,   fever, chills, sweats, unintended wt loss or wt gain, classically pleuritic or exertional cp,  orthopnea pnd or arm/hand swelling  or leg swelling, presyncope, palpitations, abdominal pain, anorexia, nausea, vomiting, diarrhea  or change in bowel habits or change in bladder habits, change in stools or change in urine, dysuria, hematuria,  rash, arthralgias, visual complaints, headache, numbness, weakness or ataxia or problems with walking or coordination,  change in mood or  Memory.  (only 3 items not checked on survey)            Past Medical History:  Diagnosis Date  . Anxiety   . Chest pain    a. reports 3 nl stress tests in [place] over past 10 years.  . Depression   . DVT (deep venous thrombosis) ([place])    a. 2016 R DVT after knee surgery  tx w/ coumadin x 2 mos.  . Fibromyalgia   . GERD (gastroesophageal reflux disease)   . Hyperlipidemia   . Hypothyroidism   . Seasonal allergies   . Snoring    a. sometimes awakes @ night gasping.    Outpatient Medications Prior to Visit  Medication Sig Dispense Refill  . amitriptyline (ELAVIL) 25 MG tablet Take 25 mg by mouth at bedtime.    Marland Kitchen  baclofen (LIORESAL) 10 MG tablet Take 20 mg by mouth 3 (three) times daily.     . Camphor-Menthol-Methyl Sal (TIGER BALM MUSCLE RUB EX) Apply 1 application topically 2 (two) times daily as needed (muscle pain).    . Cholecalciferol (DIALYVITE VITAMIN D 5000 PO) Take 5,000 Units by mouth daily.    . DULoxetine (CYMBALTA) 60 MG capsule Take 60 mg by mouth daily.    . furosemide (LASIX) 20 MG tablet Take 1 tablet (20 mg total) by mouth daily for 3 days. 3 tablet 0  . ibuprofen (ADVIL,MOTRIN) 200 MG tablet Take 400 mg by mouth every 6 (six) hours as needed for moderate pain.     Marland Kitchen lansoprazole (PREVACID) 30 MG capsule Take 30 mg by mouth daily at 12  noon.    Marland Kitchen levothyroxine (SYNTHROID, LEVOTHROID) 100 MCG tablet Take 100 mcg by mouth daily before breakfast.    . potassium chloride SA (K-DUR,KLOR-CON) 20 MEQ tablet Take 1 tablet (20 mEq total) by mouth daily for 3 days. 3 tablet 0  . pravastatin (PRAVACHOL) 40 MG tablet Take 40 mg by mouth daily.    . traZODone (DESYREL) 100 MG tablet Take 100 mg by mouth at bedtime.    . vitamin B-12 (CYANOCOBALAMIN) 1000 MCG tablet Take 1,000 mcg by mouth daily.    . Albuterol Sulfate (PROAIR RESPICLICK) 400 (90 Base) MCG/ACT AEPB Inhale 2 puffs into the lungs every 4 (four) hours as needed (cough and wheezing). (Patient not taking: Reported on 04/21/2018) 1 each 0  . alum & mag hydroxide-simeth (MAALOX/MYLANTA) 200-200-20 MG/5ML suspension Take 15 mLs by mouth every 6 (six) hours as needed for indigestion or heartburn. (Patient not taking: Reported on 03/03/2018) 355 mL 0  . azithromycin (ZITHROMAX) 250 MG tablet Take 1 tablet (250 mg total) by mouth daily. Take first 2 tablets together, then 1 every day until finished. (Patient not taking: Reported on 04/21/2018) 6 tablet 0  . lidocaine (XYLOCAINE) 2 % solution Use as directed 15 mLs in the mouth or throat as needed for mouth pain. (Patient not taking: Reported on 12/23/2017) 100 mL 2      Objective:     BP 124/78 (BP Location: Left Arm, Cuff Size: Normal)   Pulse (!) 109   Ht 5\' 7"  (1.702 m)   Wt 211 lb 3.2 oz (95.8 kg)   SpO2 97%   BMI 33.08 kg/m   SpO2: 97 %  RA   Obese wf nad  HEENT: nl dentition, turbinates bilaterally, and oropharynx. Nl external ear canals without cough reflex   NECK :  without JVD/Nodes/TM/ nl carotid upstrokes bilaterally   LUNGS: no acc muscle use,  Nl contour chest which is clear to A and P bilaterally without cough on insp or exp maneuvers   CV:  RRR  no s3 or murmur or increase in P2, and no edema   ABD:  Obese/ soft and nontender with nl inspiratory excursion in the supine position. No bruits or organomegaly  appreciated, bowel sounds nl  MS:  Nl gait/ ext warm without deformities, calf tenderness, cyanosis or clubbing No obvious joint restrictions   SKIN: warm and dry without lesions    NEURO:  alert, approp, nl sensorium with  no motor or cerebellar deficits apparent.      I personally reviewed images and agree with radiology impression as follows:  CXR:   04/21/18 1. Mild bilateral pulmonary interstitial prominence. Mild edema is and or pneumonitis could present this fashion. Previously identified left base  infiltrate has cleared.  2.  Mild cardiomegaly.  Heart size stable   Labs   reviewed:      Chemistry      Component Value Date/Time   NA 140 04/21/2018 1020   K 4.2 04/21/2018 1020   CL 107 04/21/2018 1020   CO2 25 04/21/2018 1020   BUN 6 (L) 04/21/2018 1020   CREATININE 0.76 04/21/2018 1020      Component Value Date/Time   CALCIUM 8.7 (L) 04/21/2018 1020   ALKPHOS 61 04/21/2018 1020   AST 24 04/21/2018 1020   ALT 26 04/21/2018 1020   BILITOT 0.4 04/21/2018 1020        Lab Results  Component Value Date   WBC 8.5 04/21/2018   HGB 13.3 04/21/2018   HCT 41.3 04/21/2018   MCV 90.0 04/21/2018   PLT 354 04/21/2018     Lab Results  Component Value Date   DDIMER 0.31 12/23/2017     BNP  04/21/18  34    Echo  04/24/18 The left ventricle has normal systolic function with an ejection fraction of 60-65%. The cavity size was normal. There is mildly increased left ventricular wall thickness. Left ventricular diastolic Doppler parameters are consistent with impaired  Relaxation. O/w wnl               Assessment   DOE (dyspnea on exertion) Onset 2016 but worse in 2018 with neg cardiac eval out of state  - Spirometry 05/04/2018  FEV1 1.9 (68%)  Ratio 0.86 s curvature/ no prior rx   - 05/04/2018   Walked RA  2 laps @  approx 221ft each @ avg pace  stopped due to  End of study, light headed and mild sob/ no cp     Symptoms are markedly disproportionate to  objective findings and not clear to what extent this is actually a pulmonary  problem but pt does appear to have difficult to sort out respiratory symptoms of unknown origin for which  DDX  = almost all start with A and  include Adherence, Ace Inhibitors, Acid Reflux, Active Sinus Disease, Alpha 1 Antitripsin deficiency, Anxiety masquerading as Airways dz,  ABPA,  Allergy(esp in young), Aspiration (esp in elderly), Adverse effects of meds,  Active smoking or Vaping, A bunch of PE's/clot burden (a few small clots can't cause this syndrome unless there is already severe underlying pulm or vascular dz with poor reserve),  Anemia or thyroid disorder, plus two Bs  = Bronchiectasis and Beta blocker use..and one C= CHF     Adherence is always the initial "prime suspect" and is a multilayered concern that requires a "trust but verify" approach in every patient - starting with knowing how to use medications, especially inhalers, correctly, keeping up with refills and understanding the fundamental difference between maintenance and prns vs those medications only taken for a very short course and then stopped and not refilled.  - return with all meds in hand using a trust but verify approach to confirm accurate Medication  Reconciliation The principal here is that until we are certain that the  patients are doing what we've asked, it makes no sense to ask them to do more.    ? Acid (or non-acid) GERD > always difficult to exclude as up to 75% of pts in some series report no assoc GI/ Heartburn symptoms and she has overt vomiting during some of her coughing/sob spells the ultimate form of gerd with assoc atypical cp with neg cards w/u in  past > rec max (24h)  acid suppression and diet restrictions/ reviewed and instructions given in writing.   Active smoking (see separate a/p)   ? Anxiety > usually at the bottom of this list of usual suspects but should be much higher on this pt's based on H and P and note already on  psychotropics and may interfere with adherence and also interpretation of response or lack thereof to symptom management which can be quite subjective.   Anemia/ thyroid dz > she is on synthroid monitor by pcp and not anemic  ? A bunch of PE's > pos h/o dvt but nothing to suggest this now   ? chf  > very low bnp rules out despite suggestion of diastolic dysfunction on echo          Cigarette smoker Counseled re importance of smoking cessation but did not meet time criteria for separate billing      Obesity (BMI 30-39.9) Wt gain since knee surgery 2016 vs baseline of < 140 lb  - ? Restrictive changes on spirometry 05/04/2018 > full pfts ordered   Body mass index is 33.08 kg/m.  -   No results found for: TSH   Contributing to gerd risk/ doe/reviewed the need and the process to achieve and maintain neg calorie balance > defer f/u primary care including intermittently monitoring thyroid status        Total time devoted to counseling  > 50 % of initial 60 min office visit:  review case with pt/direct observation of amb 02 study/  discussion of options/alternatives/ personally creating written customized instructions  in presence of pt  then going over those specific  Instructions directly with the pt including how to use all of the meds but in particular covering each new medication in detail and the difference between the maintenance= "automatic" meds and the prns using an action plan format for the latter (If this problem/symptom => do that organization reading Left to right).  Please see AVS from this visit for a full list of these instructions which I personally wrote for this pt and  are unique to this visit.      Christinia Gully, MD 05/04/2018

## 2018-05-04 NOTE — Patient Instructions (Addendum)
Change prevacid to Take 30- 60 min before your first and last meals of the day   GERD (REFLUX)  is an extremely common cause of respiratory symptoms just like yours , many times with no obvious heartburn at all.    It can be treated with medication, but also with lifestyle changes including elevation of the head of your bed (ideally with 6 -8inch blocks under the headboard of your bed),  Smoking cessation, avoidance of late meals, excessive alcohol, and avoid fatty foods, chocolate, peppermint, colas, red wine, and acidic juices such as orange juice.  NO MINT OR MENTHOL PRODUCTS SO NO COUGH DROPS  USE SUGARLESS CANDY INSTEAD (Jolley ranchers or Stover's or Life Savers) or even ice chips will also do - the key is to swallow to prevent all throat clearing. NO OIL BASED VITAMINS - use powdered substitutes.  Avoid fish oil when coughing.  The key is to stop smoking completely before smoking completely stops you! - For smoking cessation help/classes call [phone]   Please schedule a follow up office visit in 6 weeks, call sooner if needed with all medications /inhalers/ solutions in hand so we can verify exactly what you are taking. This includes all medications from all doctors and over the counters  - needs full pfts on return

## 2018-05-05 ENCOUNTER — Telehealth: Payer: Self-pay | Admitting: *Deleted

## 2018-05-05 ENCOUNTER — Encounter: Payer: Self-pay | Admitting: Internal Medicine

## 2018-05-05 DIAGNOSIS — E669 Obesity, unspecified: Secondary | ICD-10-CM | POA: Insufficient documentation

## 2018-05-05 DIAGNOSIS — F17210 Nicotine dependence, cigarettes, uncomplicated: Secondary | ICD-10-CM | POA: Insufficient documentation

## 2018-05-05 NOTE — Assessment & Plan Note (Signed)
Wt gain since knee surgery 2016 vs baseline of < 140 lb  - ? Restrictive changes on spirometry 05/04/2018 > full pfts ordered    Body mass index is 33.08 kg/m.  -   No results found for: TSH   Contributing to gerd risk/ doe/reviewed the need and the process to achieve and maintain neg calorie balance > defer f/u primary care including intermittently monitoring thyroid status     Total time devoted to counseling  > 50 % of initial 60 min office visit:  review case with pt/direct observation of amb 02 study/  discussion of options/alternatives/ personally creating written customized instructions  in presence of pt  then going over those specific  Instructions directly with the pt including how to use all of the meds but in particular covering each new medication in detail and the difference between the maintenance= "automatic" meds and the prns using an action plan format for the latter (If this problem/symptom => do that organization reading Left to right).  Please see AVS from this visit for a full list of these instructions which I personally wrote for this pt and  are unique to this visit.

## 2018-05-05 NOTE — Assessment & Plan Note (Addendum)
Onset 2016 but worse in 2018 with [place] cardiac eval [place] of state  - Spirometry 05/04/2018  FEV1 1.9 (68%)  Ratio 0.86 s curvature/ no prior rx   - 05/04/2018   Walked RA  2 laps @  approx 270ft each @ avg pace  stopped due to  End of study, light headed and mild sob/ no cp     Symptoms are markedly disproportionate to objective findings and not clear to what extent this is actually a pulmonary  problem but pt does appear to have difficult to sort out respiratory symptoms of unknown origin for which  DDX  = almost all start with A and  include Adherence, Ace Inhibitors, Acid Reflux, Active Sinus Disease, Alpha 1 Antitripsin deficiency, Anxiety masquerading as Airways dz,  ABPA,  Allergy(esp in young), Aspiration (esp in elderly), Adverse effects of meds,  Active smoking or Vaping, A bunch of PE's/clot burden (a few small clots can't cause this syndrome unless there is already severe underlying pulm or vascular dz with poor reserve),  Anemia or thyroid disorder, plus two Bs  = Bronchiectasis and Beta blocker use..and one C= CHF     Adherence is always the initial "prime suspect" and is a multilayered concern that requires a "trust but verify" approach in every patient - starting with knowing how to use medications, especially inhalers, correctly, keeping up with refills and understanding the fundamental difference between maintenance and prns vs those medications only taken for a very short course and then stopped and not refilled.  - return with all meds in hand using a trust but verify approach to confirm accurate Medication  Reconciliation The principal here is that until we are certain that the  patients are doing what we've asked, it makes no sense to ask them to do more.    ? Acid (or non-acid) GERD > always difficult to exclude as up to 75% of pts in some series report no assoc GI/ Heartburn symptoms and she has overt vomiting during some of her coughing/sob spells the ultimate form of gerd with assoc  atypical cp with neg cards w/u in past > rec max (24h)  acid suppression and diet restrictions/ reviewed and instructions given in writing.   Active smoking (see separate a/p)   ? Anxiety > usually at the bottom of this list of usual suspects but should be much higher on this pt's based on H and P and note already on psychotropics and may interfere with adherence and also interpretation of response or lack thereof to symptom management which can be quite subjective.   Anemia/ thyroid dz > she is on synthroid monitor by pcp and not anemic  ? A bunch of PE's > pos h/o dvt but nothing to suggest this now   ? chf  > very low bnp rules out despite suggestion of diastolic dysfunction on echo

## 2018-05-05 NOTE — Telephone Encounter (Signed)
LMTCB

## 2018-05-05 NOTE — Assessment & Plan Note (Signed)
Counseled re importance of smoking cessation but did not meet time criteria for separate billing   °

## 2018-05-05 NOTE — Telephone Encounter (Signed)
-----   Message from Tanda Rockers, MD sent at 05/05/2018  6:14 AM EST ----- Needs full pfts on return

## 2018-05-06 NOTE — Telephone Encounter (Signed)
LMTCB

## 2018-05-11 NOTE — Telephone Encounter (Signed)
LMTCB and closing per protocol 

## 2018-05-14 DIAGNOSIS — K219 Gastro-esophageal reflux disease without esophagitis: Secondary | ICD-10-CM | POA: Diagnosis not present

## 2018-05-20 DIAGNOSIS — E559 Vitamin D deficiency, unspecified: Secondary | ICD-10-CM | POA: Diagnosis not present

## 2018-06-10 ENCOUNTER — Other Ambulatory Visit: Payer: Self-pay

## 2018-06-10 ENCOUNTER — Ambulatory Visit
Admission: RE | Admit: 2018-06-10 | Discharge: 2018-06-10 | Disposition: A | Discharge status: Home / Self Care | Payer: Medicare Other | Source: Ambulatory Visit | Attending: Family Medicine | Admitting: Family Medicine

## 2018-06-10 ENCOUNTER — Other Ambulatory Visit: Payer: Self-pay | Admitting: Family Medicine

## 2018-06-10 DIAGNOSIS — R0602 Shortness of breath: Secondary | ICD-10-CM

## 2018-06-10 DIAGNOSIS — R05 Cough: Secondary | ICD-10-CM | POA: Diagnosis not present

## 2018-06-10 DIAGNOSIS — J42 Unspecified chronic bronchitis: Secondary | ICD-10-CM | POA: Diagnosis not present

## 2018-06-14 ENCOUNTER — Emergency Department (HOSPITAL_COMMUNITY)
Admission: EM | Admit: 2018-06-14 | Discharge: 2018-06-14 | Disposition: A | Discharge status: Home / Self Care | Payer: Medicare Other | Attending: Emergency Medicine | Admitting: Emergency Medicine

## 2018-06-14 ENCOUNTER — Other Ambulatory Visit: Payer: Self-pay

## 2018-06-14 ENCOUNTER — Emergency Department (HOSPITAL_COMMUNITY): Payer: Medicare Other

## 2018-06-14 ENCOUNTER — Encounter (HOSPITAL_COMMUNITY): Payer: Self-pay

## 2018-06-14 DIAGNOSIS — G8929 Other chronic pain: Secondary | ICD-10-CM | POA: Diagnosis not present

## 2018-06-14 DIAGNOSIS — E039 Hypothyroidism, unspecified: Secondary | ICD-10-CM | POA: Diagnosis not present

## 2018-06-14 DIAGNOSIS — M6740 Ganglion, unspecified site: Secondary | ICD-10-CM | POA: Diagnosis not present

## 2018-06-14 DIAGNOSIS — M25511 Pain in right shoulder: Secondary | ICD-10-CM | POA: Diagnosis not present

## 2018-06-14 DIAGNOSIS — Z87891 Personal history of nicotine dependence: Secondary | ICD-10-CM | POA: Insufficient documentation

## 2018-06-14 DIAGNOSIS — M67411 Ganglion, right shoulder: Secondary | ICD-10-CM | POA: Diagnosis not present

## 2018-06-14 DIAGNOSIS — J449 Chronic obstructive pulmonary disease, unspecified: Secondary | ICD-10-CM | POA: Diagnosis not present

## 2018-06-14 HISTORY — DX: Pneumonia, unspecified organism: J18.9

## 2018-06-14 HISTORY — DX: Bronchitis, not specified as acute or chronic: J40

## 2018-06-14 HISTORY — DX: Chronic obstructive pulmonary disease, unspecified: J44.9

## 2018-06-14 MED ORDER — OXYCODONE HCL 5 MG PO TABS
5.0000 mg | ORAL_TABLET | Freq: Once | ORAL | Status: AC
  Administered 2018-06-14: 5 mg via ORAL
  Filled 2018-06-14: qty 1

## 2018-06-14 MED ORDER — HYDROCODONE-ACETAMINOPHEN 5-325 MG PO TABS: 1.0000 | ORAL_TABLET | ORAL | 0 refills | Status: DC | PRN

## 2018-06-14 MED ORDER — LIDOCAINE 5 % EX PTCH
1.0000 | MEDICATED_PATCH | CUTANEOUS | Status: DC
  Administered 2018-06-14: 1 via TRANSDERMAL
  Filled 2018-06-14: qty 1

## 2018-06-14 MED ORDER — LIDOCAINE 5 % EX PTCH: 1.0000 | MEDICATED_PATCH | CUTANEOUS | 0 refills | Status: DC

## 2018-06-14 NOTE — ED Provider Notes (Signed)
[facility] Emergency Department Provider Note MRN:  [id number]  Arrival date & time: 06/14/18     Chief Complaint   Shoulder Pain   History of Present Illness   Alexandra Harvey is a 63 y.o. year-old female with a history of COPD presenting to the ED with chief complaint of shoulder pain.  Patient reports at least 6 months of shoulder pain, worse this morning.  Denies fever, denies headache, no neck pain, no chest pain or shortness of breath, no abdominal pain.  Also endorsing over a year of decreased sensation to hands and feet.  Pain is moderate, constant, worse with motion.  Denies trauma.  Review of Systems  A complete 10 system review of systems was obtained and all systems are negative except as noted in the HPI and PMH.   Patient's Health History    Past Medical History:  Diagnosis Date  . Anxiety   . Bronchitis   . Chest pain    a. reports 3 nl stress tests in [place] over past 10 years.  Marland Kitchen COPD (chronic obstructive pulmonary disease) ([place])   . Depression   . DVT (deep venous thrombosis) ([place])    a. 2016 R DVT after knee surgery  tx w/ coumadin x 2 mos.  . Fibromyalgia   . GERD (gastroesophageal reflux disease)   . Hyperlipidemia   . Hypothyroidism   . Pneumonia   . Seasonal allergies   . Snoring    a. sometimes awakes @ night gasping.    Past Surgical History:  Procedure Laterality Date  . CARPAL TUNNEL RELEASE Right   . CERVICAL SPINE SURGERY    . CHOLECYSTECTOMY    . ELBOW SURGERY Right   . FOOT SURGERY Right   . KNEE ARTHROSCOPY Bilateral   . SHOULDER SURGERY Right   . TOTAL ABDOMINAL HYSTERECTOMY    . TOTAL KNEE ARTHROPLASTY Right     Family History  Problem Relation Age of Onset  . Atrial fibrillation Mother     Social History   Socioeconomic History  . Marital status: Divorced    Spouse name: Not on file  . Number of children: Not on file  . Years of education: Not on file  . Highest education level: Not on file  Occupational  History  . Occupation: Disabled  Social Needs  . Financial resource strain: Not on file  . Food insecurity:    Worry: Not on file    Inability: Not on file  . Transportation needs:    Medical: Not on file    Non-medical: Not on file  Tobacco Use  . Smoking status: Former Smoker    Packs/day: 1.00    Years: 40.00    Pack years: 40.00  . Smokeless tobacco: Never Used  . Tobacco comment: currently 1/2 ppd 05/04/2018//lmr  Substance and Sexual Activity  . Alcohol use: No  . Drug use: Not Currently    Types: Marijuana    Comment: smoked marijuana 2 x daily 05/04/2018//lmr  . Sexual activity: Not Currently  Lifestyle  . Physical activity:    Days per week: Not on file    Minutes per session: Not on file  . Stress: Not on file  Relationships  . Social connections:    Talks on phone: Not on file    Gets together: Not on file    Attends religious service: Not on file    Active member of club or organization: Not on file    Attends meetings of clubs  or organizations: Not on file    Relationship status: Not on file  . Intimate partner violence:    Fear of current or ex partner: Not on file    Emotionally abused: Not on file    Physically abused: Not on file    Forced sexual activity: Not on file  Other Topics Concern  . Not on file  Social History Narrative   Moved from [place] in summer of 2017.  Now lives in [place] by herself.  Does not routinely exercise.     Physical Exam  Vital Signs and Nursing Notes reviewed Vitals:   06/14/18 1144  BP: 117/88  Pulse: (!) 101  Resp: 20  Temp: 98.1 F (36.7 C)  SpO2: 97%    CONSTITUTIONAL: Well-appearing, NAD NEURO:  Alert and oriented x 3, no focal deficits EYES:  eyes equal and reactive ENT/NECK:  no LAD, no JVD CARDIO: Regular rate, well-perfused, normal S1 and S2 PULM:  CTAB no wheezing or rhonchi GI/GU:  normal bowel sounds, non-distended, non-tender MSK/SPINE:  No gross deformities, no edema; tenderness palpation to the right  shoulder with decreased range of motion due to pain sKIN:  no rash, atraumatic PSYCH:  Appropriate speech and behavior  Diagnostic and Interventional Summary    Labs Reviewed - No data to display  DG Shoulder Right  Final Result      Medications  lidocaine (LIDODERM) 5 % 1 patch (1 patch Transdermal Patch Applied 06/14/18 1210)  oxyCODONE (Oxy IR/ROXICODONE) immediate release tablet 5 mg (5 mg Oral Given 06/14/18 1210)     Procedures Critical Care  ED Course and Medical Decision Making  I have reviewed the triage vital signs and the nursing notes.  Pertinent labs & imaging results that were available during my care of the patient were reviewed by me and considered in my medical decision making (see below for details).  Acute on chronic shoulder pain, favored osteoarthritis given the duration.  Patient has painful but intact range of motion, no fever, no skin changes, little to no concern for septic joint.  Will obtain screening x-ray, attempt pain management, refer to orthopedics.    Patient's decreased sensation to the hands and feet also chronic, patient has no bowel or bladder dysfunction, no significant neck pain to suggest CNS pathology.  More likely neuropathy.  Regardless, on something that needs to be evaluated emergently today.  Will advise close PCP follow-up for this issue.  X-rays without acute concerns, mild degenerative changes suggesting arthritis.  Patient explains that her PCP is aware of her chronic numbness to the hands and feet.  Upon closer inspection to the right shoulder, there is a prominence near the [facility] joint that seems most consistent with ganglion cyst.  Firm to the touch but no erythema, no signs of infection.  Best handled by an orthopedic specialist, contact information given and referred.  Sling for comfort, advised to range her shoulder multiple times a day to prevent frozen shoulder.  After the discussed management above, the patient was determined to be  safe for discharge.  The patient was in agreement with this plan and all questions regarding their care were answered.  ED return precautions were discussed and the patient will return to the ED with any significant worsening of condition.  Barth Kirks. Sedonia Small, [place] [email or web address]  Final Clinical Impressions(s) / ED Diagnoses     ICD-10-CM   1. Chronic right shoulder pain M25.511    G89.29  2. Ganglion cyst M67.40     ED Discharge Orders         Ordered    HYDROcodone-acetaminophen (NORCO/VICODIN) 5-325 MG tablet  Every 4 hours PRN     06/14/18 1318    lidocaine (LIDODERM) 5 %  Every 24 hours     06/14/18 1318             Maudie Flakes, MD 06/14/18 1319

## 2018-06-14 NOTE — ED Triage Notes (Signed)
Patient c/o right shoulder pain for months, worse today. Patient states she put off seeing about her shoulder pain due to recent episodes of COPD and pneumonia.

## 2018-06-14 NOTE — Discharge Instructions (Signed)
You were evaluated in the Emergency Department and after careful evaluation, we did not find any emergent condition requiring admission or further testing in the hospital.  Your symptoms today seem to be due to a ganglion cyst of the right shoulder.  Please follow up with orthopedics for further management.  Be sure to remove the splint and range the shoulder several times a day.  Please return to the Emergency Department if you experience any worsening of your condition.  We encourage you to follow up with a primary care provider.  Thank you for allowing [place] to be a part of your care.

## 2018-06-16 ENCOUNTER — Encounter: Payer: Self-pay | Admitting: Internal Medicine

## 2018-06-22 ENCOUNTER — Encounter: Payer: Self-pay | Admitting: Internal Medicine

## 2018-06-22 ENCOUNTER — Other Ambulatory Visit: Payer: Self-pay

## 2018-06-22 ENCOUNTER — Telehealth: Payer: Self-pay | Admitting: Internal Medicine

## 2018-06-22 ENCOUNTER — Ambulatory Visit (INDEPENDENT_AMBULATORY_CARE_PROVIDER_SITE_OTHER): Payer: Medicare Other | Admitting: Internal Medicine

## 2018-06-22 ENCOUNTER — Ambulatory Visit: Payer: Medicare Other | Admitting: Internal Medicine

## 2018-06-22 DIAGNOSIS — K219 Gastro-esophageal reflux disease without esophagitis: Secondary | ICD-10-CM | POA: Diagnosis not present

## 2018-06-22 DIAGNOSIS — J449 Chronic obstructive pulmonary disease, unspecified: Secondary | ICD-10-CM | POA: Diagnosis not present

## 2018-06-22 DIAGNOSIS — R0609 Other forms of dyspnea: Secondary | ICD-10-CM | POA: Diagnosis not present

## 2018-06-22 DIAGNOSIS — R0600 Dyspnea, unspecified: Secondary | ICD-10-CM

## 2018-06-22 MED ORDER — LANSOPRAZOLE 30 MG PO CPDR: DELAYED_RELEASE_CAPSULE | ORAL | 2 refills | Status: AC

## 2018-06-22 NOTE — Telephone Encounter (Signed)
Called and spoke with pt in regards to info from [facility]. Pt stated she feels okay when she is at rest that her symptoms begin when she is up moving around. Asked pt if she felt like she could come in to office for appt and she stated that she felt like she could. I have scheduled OV for pt with MW today at 3:15 and stated to pt to bring meds with her when she comes. Pt expressed understanding. Nothing further needed.

## 2018-06-22 NOTE — Patient Instructions (Addendum)
Continue symbicort 160 Take 2 puffs first thing in am and then another 2 puffs about 12 hours later.   Only use your albuterol as a rescue medication to be used if you can't catch your breath by resting or doing a relaxed purse lip breathing pattern.  - The less you use it, the better it will work when you need it. - Ok to use up to 2 puffs  every 4 hours if you must but call for immediate appointment if use goes up over your usual need - Don't leave home without it !!  (think of it like the spare tire for your car)    Prevacid 30 mg Take 30- 60 min before your first and last meals of the day   GERD (REFLUX)  is an extremely common cause of respiratory symptoms just like yours , many times with no obvious heartburn at all.    It can be treated with medication, but also with lifestyle changes including elevation of the head of your bed (ideally with 6 -8inch blocks under the headboard of your bed),  Smoking cessation, avoidance of late meals, excessive alcohol, and avoid fatty foods, chocolate, peppermint, colas, red wine, and acidic juices such as orange juice.  NO MINT OR MENTHOL PRODUCTS SO NO COUGH DROPS  USE SUGARLESS CANDY INSTEAD (Jolley ranchers or Stover's or Life Savers) or even ice chips will also do - the key is to swallow to prevent all throat clearing. NO OIL BASED VITAMINS - use powdered substitutes.  Avoid fish oil when coughing.  Please remember to go to the lab department   for your tests - we will call you with the results when they are available.      Please schedule a follow up visit in 3 months but call sooner if needed  with all medications /inhalers/ solutions in hand so we can verify exactly what you are taking. This includes all medications from all doctors and over the counters  - full pfts on return and DO NOT [place]

## 2018-06-22 NOTE — Progress Notes (Signed)
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 Copd evolving?     History of Present Illness  05/04/2018  Pulmonary/ 1st office eval/Shantasia Hunnell  Chief Complaint  Patient presents with  . Pulmonary Consult    Referred by Jillyn Ledger, FNP.  Pt c/o DOE x 8 months. She states she gets winded just walking room to room.   Dyspnea:  Room to room  Cough: sense of pnds but no actual mucus with gag/vomit sev times a months and episodes where can't speak at all  Sleep: electric bed at 30 degrees due to gerd  SABA use: albuterol did not correct it  ppi not ac  rec Change prevacid to Take 30- 60 min before your first and last meals of the day  GERD diet  The key is to stop smoking completely before smoking completely stops you! Please schedule a follow up office visit in 6 weeks, call sooner if needed with all medications /inhalers/ solutions in hand so we can verify exactly what you are taking. This includes all medications from all doctors and over the counters     06/22/2018  f/u ov/Toney Difatta re: cough/ sob / did not bring all meds / NO longer smoking Chief Complaint  Patient presents with  . Follow-up    F/U re: DOE. She states her SOB is not getting any better. States she was recently dx with COPD.   Dyspnea:  Room to room, feels like muscles locking up > sob  Cough: clear mucus esp in  am's improved since quit smoking   Sleeping: electric bed head up 45 degrees SABA use: confused with names of inhalers, says she paid 400 dollars for albuterol and not helping Not using ppi as rec (bid ac)   02: none    No obvious day to day or daytime variability or assoc excess/ purulent sputum or mucus plugs or hemoptysis   subjective wheeze or overt sinus or hb symptoms.   Sleeping as above  without nocturnal  or early am exacerbation  of respiratory  c/o's or need for noct saba. Also denies any obvious fluctuation of symptoms with weather or environmental changes or other aggravating or alleviating factors except as outlined above   No unusual exposure hx or h/o childhood pna/ asthma or knowledge of premature birth.  Current Allergies, Complete Past Medical History, Past Surgical History, Family History, and Social History were reviewed in [employer] record.  ROS  The following are not active complaints unless bolded Hoarseness, sore throat, dysphagia, dental problems, itching, sneezing,  nasal congestion or discharge of excess mucus or purulent secretions, ear ache,   fever, chills, sweats, unintended wt loss or wt gain, classically pleuritic or exertional cp,  orthopnea pnd or arm/hand swelling  or leg swelling, presyncope, palpitations, abdominal  pain, anorexia, nausea, vomiting, diarrhea  or change in bowel habits or change in bladder habits, change in stools or change in urine, dysuria, hematuria,  rash, arthralgias, visual complaints, headache, numbness, weakness or ataxia or problems with walking or coordination,  change in mood or  memory.        Current Meds  Medication Sig  . albuterol (PROVENTIL HFA;VENTOLIN HFA) 108 (90 Base) MCG/ACT inhaler Inhale 1-2 puffs into the lungs every 6 (six) hours as needed for wheezing or shortness of breath.   Marland Kitchen amitriptyline (ELAVIL) 25 MG tablet Take 25 mg by mouth at bedtime.  . baclofen (LIORESAL) 10 MG tablet Take 20  mg by mouth 3 (three) times daily.   . budesonide-formoterol (SYMBICORT) 160-4.5 MCG/ACT inhaler Inhale 2 puffs into the lungs 2 (two) times daily.  . Camphor-Menthol-Methyl Sal (TIGER BALM MUSCLE RUB EX) Apply 1 application topically 2 (two) times daily as needed (muscle pain).  . Cholecalciferol (DIALYVITE VITAMIN D 5000 PO) Take 5,000 Units by mouth daily.  . DULoxetine (CYMBALTA) 60 MG capsule Take 60 mg by mouth daily.  Marland Kitchen HYDROcodone-acetaminophen (NORCO/VICODIN) 5-325 MG tablet Take 1 tablet by mouth every 4 (four) hours as needed.  Marland Kitchen ibuprofen (ADVIL,MOTRIN) 200 MG tablet Take 600 mg by mouth every 6 (six) hours as needed for moderate pain.   Marland Kitchen lansoprazole (PREVACID) 30 MG capsule Take 30- 60 min before your first and last meals of the day  . levothyroxine (SYNTHROID, LEVOTHROID) 100 MCG tablet Take 100 mcg by mouth daily before breakfast.  . lidocaine (LIDODERM) 5 % Place 1 patch onto the skin daily. Remove & Discard patch within 12 hours or as directed by MD  . pravastatin (PRAVACHOL) 40 MG tablet Take 40 mg by mouth daily.  . traZODone (DESYREL) 100 MG tablet Take 100 mg by mouth at bedtime.  . vitamin B-12 (CYANOCOBALAMIN) 1000 MCG tablet Take 1,000 mcg by mouth daily.          Objective:    amb anxious wf  who failed to answer questions asked in a straightforward manner, tending to go off on tangents or answer questions with ambiguous medical terms or diagnoses and seemed perplexed  when asked the same question more than once for clarification.    Wt Readings from Last 3 Encounters:  06/22/18 213 lb (96.6 kg)  06/14/18 210 lb (95.3 kg)  05/04/18 211 lb 3.2 oz (95.8 kg)     Vital signs reviewed - Note on arrival 02 sats  97% on RA     HEENT: nl dentition, turbinates bilaterally, and oropharynx. Nl external ear canals without cough reflex   NECK :  without JVD/Nodes/TM/ nl carotid upstrokes bilaterally   LUNGS: no acc muscle use,  Nl contour chest which is clear to A and  P bilaterally without cough on insp or exp maneuvers   CV:  RRR  no s3 or murmur or increase in P2, and no edema   ABD:  soft and nontender with nl inspiratory excursion in the supine position. No bruits or organomegaly appreciated, bowel sounds nl  MS:  Nl gait/ ext warm without deformities, calf tenderness, cyanosis or clubbing No obvious joint restrictions   SKIN: warm and dry without lesions    NEURO:  alert, approp, nl sensorium with  no motor or cerebellar deficits apparent.       I personally reviewed images and agree with radiology impression as follows:  CXR:   06/10/18 Findings most consistent with chronic bronchitis.  No  acute disease    Labs ordered/ reviewed:      Chemistry      Component Value Date/Time   NA 140 04/21/2018 1020   K 4.2 04/21/2018 1020   CL 107 04/21/2018 1020   CO2 25 04/21/2018 1020   BUN 6 (L) 04/21/2018 1020   CREATININE 0.76 04/21/2018 1020      Component Value Date/Time   CALCIUM 8.7 (L) 04/21/2018 1020   ALKPHOS 61 04/21/2018 1020   AST 24 04/21/2018 1020   ALT 26 04/21/2018 1020   BILITOT 0.4 04/21/2018 1020        Lab Results  Component Value Date   WBC 8.9 06/22/2018   HGB 12.8 06/22/2018   HCT 37.6 06/22/2018   MCV 85.8 06/22/2018   PLT 354 06/22/2018       EOS                                                               303                                   06/22/2018    Labs ordered 06/22/2018    Allergy profile     PROBNP   04/21/2018 = 34     Lab Results  Component Value Date   ESRSEDRATE 6 06/22/2018

## 2018-06-22 NOTE — Telephone Encounter (Signed)
Nothing to offer over the phone. If can't get comfortable at rest she should go to ER - if gets better sitting still then needs ov with all meds/ inhalers in hand

## 2018-06-22 NOTE — Telephone Encounter (Signed)
Primary Pulmonologist: Dr. Christinia Gully Last office visit and with whom: 05/04/2018 w/ MW What do we see them for (pulmonary problems): DOE, COPD  Reason for call: Last seen 05/04/2018 by MW. Pt states she's been sick for 1 year, recently Dx w/ COPD. SOB, cough w/ clear mucus, "norse, throat, and chest" congestion, wheezing, chest tightness, "big time" sweats. States she had some chills 3 weeks ago but denies fever symptoms today. Taking her rescue inhaler & Symbicort inhaler, states not therapeutic. Taking GERD medication as directed. Denies taking any OTC medications. I observed pt to be very congested over the phone. Pt does not currently have an OV set up. MW, please advise if an OV is the route you would like to take.  In the last month, have you been in contact with someone who was confirmed or suspected to have [place]avirus / COVID-19?  No  Do you have any of the following symptoms developed in the last 30 days? Fever: No Cough: Yes Shortness of breath: Yes  When did your symptoms start?  Started over 1y ago  If the patient has a fever, what is the last reading?  (use n/a if patient denies fever)  N/A . IF THE PATIENT STATES THEY DO NOT OWN A THERMOMETER, THEY MUST GO AND PURCHASE ONE When did the fever start?: N/A Have you taken any medication to suppress a fever (ie Ibuprofen, Aleve, Tylenol)?: N/A  MW, please advise on your recommendations for this patient. Thank you.

## 2018-06-23 ENCOUNTER — Telehealth: Payer: Self-pay | Admitting: Internal Medicine

## 2018-06-23 ENCOUNTER — Encounter: Payer: Self-pay | Admitting: Internal Medicine

## 2018-06-23 DIAGNOSIS — K219 Gastro-esophageal reflux disease without esophagitis: Secondary | ICD-10-CM | POA: Insufficient documentation

## 2018-06-23 DIAGNOSIS — J449 Chronic obstructive pulmonary disease, unspecified: Secondary | ICD-10-CM | POA: Insufficient documentation

## 2018-06-23 LAB — RESPIRATORY ALLERGY PROFILE REGION II ~~LOC~~
Allergen, A. alternata, m6: <0.1 kU/L
Allergen, Cedar tree, t12: <0.1 kU/L
Allergen, Comm Silver Birch, t9: <0.1 kU/L
Allergen, Cottonwood, t14: <0.1 kU/L
Allergen, D pternoyssinus,d7: <0.1 kU/L
Allergen, Mouse Urine Protein, e78: <0.1 kU/L
Allergen, Mulberry, t76: <0.1 kU/L
Allergen, Oak,t7: <0.1 kU/L
Allergen, P. notatum, m1: <0.1 kU/L
Aspergillus fumigatus, m3: <0.1 kU/L
Bermuda Grass: <0.1 kU/L
Box Elder IgE: <0.1 kU/L
CLADOSPORIUM HERBARUM (M2) IGE: <0.1 kU/L
COMMON RAGWEED (SHORT) (W1) IGE: <0.1 kU/L
Cat Dander: <0.1 kU/L
Class: 0
Class: 0
Class: 0
Class: 0
Class: 0
Class: 0
Class: 0
Class: 0
Class: 0
Class: 0
Class: 0
Class: 0
Class: 0
Class: 0
Class: 0
Class: 0
Class: 0
Class: 0
Class: 0
Class: 0
Class: 0
Class: 0
Class: 0
Class: 0
Cockroach: <0.1 kU/L
D. farinae: <0.1 kU/L
Dog Dander: 0.11 kU/L — ABNORMAL HIGH
Elm IgE: <0.1 kU/L
IgE (Immunoglobulin E), Serum: 6 kU/L (ref <=114)
Johnson Grass: <0.1 kU/L
Pecan/Hickory Tree IgE: <0.1 kU/L
Rough Pigweed  IgE: <0.1 kU/L
Sheep Sorrel IgE: <0.1 kU/L
Timothy Grass: <0.1 kU/L

## 2018-06-23 LAB — CBC WITH DIFFERENTIAL/PLATELET
Absolute Monocytes: 552 cells/uL (ref 200–950)
Basophils Absolute: 89 cells/uL (ref 0–200)
Basophils Relative: 1 %
Eosinophils Absolute: 303 cells/uL (ref 15–500)
Eosinophils Relative: 3.4 %
HCT: 37.6 % (ref 35.0–45.0)
Hemoglobin: 12.8 g/dL (ref 11.7–15.5)
Lymphs Abs: 3694 cells/uL (ref 850–3900)
MCH: 29.2 pg (ref 27.0–33.0)
MCHC: 34 g/dL (ref 32.0–36.0)
MCV: 85.8 fL (ref 80.0–100.0)
MPV: 11.3 fL (ref 7.5–12.5)
Monocytes Relative: 6.2 %
Neutro Abs: 4263 cells/uL (ref 1500–7800)
Neutrophils Relative %: 47.9 %
Platelets: 354 10*3/uL (ref 140–400)
RBC: 4.38 10*6/uL (ref 3.80–5.10)
RDW: 12.9 % (ref 11.0–15.0)
Total Lymphocyte: 41.5 %
WBC: 8.9 10*3/uL (ref 3.8–10.8)

## 2018-06-23 LAB — INTERPRETATION:

## 2018-06-23 LAB — SEDIMENTATION RATE: Sed Rate: 6 mm/h (ref 0–30)

## 2018-06-23 NOTE — Telephone Encounter (Signed)
Called the patient back and advised her of the lab results. Patient voiced understanding. Nothing further needed at this time.

## 2018-06-23 NOTE — Assessment & Plan Note (Addendum)
Again rec max empirical rx for gerd =  ppi bid ac    I had an extended discussion with the patient reviewing all relevant studies completed to date and  lasting 25 minutes of a 40  minute  visit addressing   severe non-specific but potentially very serious refractory respiratory symptoms of uncertain and potentially multiple  Etiologies.   See device teaching which extended face to face time for this visit as did personally observing amb 02 saturations study    Each maintenance medication was reviewed in detail including most importantly the difference between maintenance and prns and under what circumstances the prns are to be triggered using an action plan format that is not reflected in the computer generated alphabetically organized AVS.    Please see AVS for specific instructions unique to this office visit that I personally wrote and verbalized to the the pt in detail and then reviewed with pt  by my nurse highlighting any changes in therapy/plan of care  recommended at today's visit.

## 2018-06-23 NOTE — Assessment & Plan Note (Signed)
Stopped smoking 05/2018 with mild CB changes on cxr 06/10/2018 - - Spirometry 05/04/2018  FEV1 1.9 (68%)  Ratio 0.86 s curvature/ no prior rx    - 06/22/2018  After extensive coaching inhaler device,  effectiveness =    75% continue symbicort 160 Take 2 puffs first thing in am and then another 2 puffs about 12 hours later.     I reviewed the Fletcher curve with the patient that basically indicates  if you quit smoking when your best day FEV1 is still well preserved (as is clearly  the case here)  it is highly unlikely you will progress to severe disease and informed the patient there was  no medication on the market that has proven to alter the curve/ its downward trajectory  or the likelihood of progression of their disease(unlike other chronic medical conditions such as atheroclerosis where we do think we can change the natural hx with risk reducing meds)      >>>  Therefore maintaining abstinence is  the most important aspect  Of her  care, not choice of inhalers or for that matter, doctors.   Treatment other than smoking cessation  is entirely directed by severity of symptoms and focused also on reducing exacerbations, not attempting to change the natural history of the disease.   >>> f/u in 3 m with full pfts, call sooner if needed

## 2018-06-23 NOTE — Assessment & Plan Note (Signed)
Onset 2016 but worse in 2018 [place] cardiac [place]   - 05/04/2018   [facility] RA  2 laps @  approx 250 ft each @ avg pace  stopped due to  End of study, light headed and mild sob/ no cp  - 06/22/2018   Walked RA  3 laps @  approx 251f each @ moderate to slow  pace  stopped due to  Muscles got tight, sob but sats still mid 90s - Allergy profile 06/22/2018 >  Eos 303  /  IgE pending      Unable to reproduce her doe room to room complaint here and most of her complaints now are not cardiopulmonary in nature.  She has a very low esr so unlikely to be related to underlying ca, rheumatologic dz or infectious dz which leads me back to :  ? Anxiety > usually at the bottom of this list of usual suspects but should be much higher on this pt's based on H and P and note already on psychotropics and may interfere with adherence and also interpretation of response or lack thereof to symptom management which can be quite subjective> refer back to pcp/ psych    Pulmonary f/u will be in 3 m with full pfts, sooner if needed

## 2018-06-29 DIAGNOSIS — R21 Rash and other nonspecific skin eruption: Secondary | ICD-10-CM | POA: Diagnosis not present

## 2018-06-29 DIAGNOSIS — G894 Chronic pain syndrome: Secondary | ICD-10-CM | POA: Diagnosis not present

## 2018-06-29 DIAGNOSIS — M797 Fibromyalgia: Secondary | ICD-10-CM | POA: Diagnosis not present

## 2018-06-29 DIAGNOSIS — F322 Major depressive disorder, single episode, severe without psychotic features: Secondary | ICD-10-CM | POA: Diagnosis not present

## 2018-06-29 DIAGNOSIS — F419 Anxiety disorder, unspecified: Secondary | ICD-10-CM | POA: Diagnosis not present

## 2018-06-29 DIAGNOSIS — Z6832 Body mass index (BMI) 32.0-32.9, adult: Secondary | ICD-10-CM | POA: Diagnosis not present

## 2018-06-29 DIAGNOSIS — R197 Diarrhea, unspecified: Secondary | ICD-10-CM | POA: Diagnosis not present

## 2018-06-30 DIAGNOSIS — R197 Diarrhea, unspecified: Secondary | ICD-10-CM | POA: Diagnosis not present

## 2018-07-07 DIAGNOSIS — S098XXA Other specified injuries of head, initial encounter: Secondary | ICD-10-CM | POA: Diagnosis not present

## 2018-07-07 DIAGNOSIS — W19XXXA Unspecified fall, initial encounter: Secondary | ICD-10-CM | POA: Diagnosis not present

## 2018-07-07 DIAGNOSIS — S63502A Unspecified sprain of left wrist, initial encounter: Secondary | ICD-10-CM | POA: Diagnosis not present

## 2018-07-07 DIAGNOSIS — M79676 Pain in unspecified toe(s): Secondary | ICD-10-CM | POA: Diagnosis not present

## 2018-09-23 ENCOUNTER — Emergency Department (HOSPITAL_COMMUNITY)
Admission: EM | Admit: 2018-09-23 | Discharge: 2018-09-24 | Disposition: A | Discharge status: Home / Self Care | Payer: Medicare Other | Attending: Emergency Medicine | Admitting: Emergency Medicine

## 2018-09-23 ENCOUNTER — Other Ambulatory Visit: Payer: Self-pay

## 2018-09-23 ENCOUNTER — Encounter (HOSPITAL_COMMUNITY): Payer: Self-pay

## 2018-09-23 ENCOUNTER — Emergency Department (HOSPITAL_COMMUNITY): Payer: Medicare Other

## 2018-09-23 DIAGNOSIS — R197 Diarrhea, unspecified: Secondary | ICD-10-CM | POA: Diagnosis not present

## 2018-09-23 DIAGNOSIS — E039 Hypothyroidism, unspecified: Secondary | ICD-10-CM | POA: Insufficient documentation

## 2018-09-23 DIAGNOSIS — K760 Fatty (change of) liver, not elsewhere classified: Secondary | ICD-10-CM | POA: Diagnosis not present

## 2018-09-23 DIAGNOSIS — F17210 Nicotine dependence, cigarettes, uncomplicated: Secondary | ICD-10-CM | POA: Diagnosis not present

## 2018-09-23 DIAGNOSIS — R05 Cough: Secondary | ICD-10-CM | POA: Diagnosis not present

## 2018-09-23 DIAGNOSIS — J189 Pneumonia, unspecified organism: Secondary | ICD-10-CM

## 2018-09-23 DIAGNOSIS — R911 Solitary pulmonary nodule: Secondary | ICD-10-CM | POA: Diagnosis not present

## 2018-09-23 DIAGNOSIS — Z96651 Presence of right artificial knee joint: Secondary | ICD-10-CM | POA: Diagnosis not present

## 2018-09-23 DIAGNOSIS — R918 Other nonspecific abnormal finding of lung field: Secondary | ICD-10-CM | POA: Diagnosis not present

## 2018-09-23 DIAGNOSIS — K5730 Diverticulosis of large intestine without perforation or abscess without bleeding: Secondary | ICD-10-CM | POA: Diagnosis not present

## 2018-09-23 DIAGNOSIS — Z20828 Contact with and (suspected) exposure to other viral communicable diseases: Secondary | ICD-10-CM | POA: Insufficient documentation

## 2018-09-23 DIAGNOSIS — Z79899 Other long term (current) drug therapy: Secondary | ICD-10-CM | POA: Diagnosis not present

## 2018-09-23 DIAGNOSIS — R112 Nausea with vomiting, unspecified: Secondary | ICD-10-CM | POA: Insufficient documentation

## 2018-09-23 DIAGNOSIS — J449 Chronic obstructive pulmonary disease, unspecified: Secondary | ICD-10-CM | POA: Diagnosis not present

## 2018-09-23 DIAGNOSIS — R1032 Left lower quadrant pain: Secondary | ICD-10-CM | POA: Diagnosis present

## 2018-09-23 LAB — URINALYSIS, ROUTINE W REFLEX MICROSCOPIC
Bilirubin Urine: NEGATIVE
Glucose, UA: NEGATIVE mg/dL
Hgb urine dipstick: NEGATIVE
Ketones, ur: NEGATIVE mg/dL
Leukocytes,Ua: NEGATIVE
Nitrite: NEGATIVE
Protein, ur: NEGATIVE mg/dL
Specific Gravity, Urine: 1.01 (ref 1.005–1.030)
pH: 6 (ref 5.0–8.0)

## 2018-09-23 LAB — COMPREHENSIVE METABOLIC PANEL
ALT: 21 U/L (ref 0–44)
AST: 17 U/L (ref 15–41)
Albumin: 4.1 g/dL (ref 3.5–5.0)
Alkaline Phosphatase: 58 U/L (ref 38–126)
Anion gap: 10 (ref 5–15)
BUN: 7 mg/dL — ABNORMAL LOW (ref 8–23)
CO2: 25 mmol/L (ref 22–32)
Calcium: 9.2 mg/dL (ref 8.9–10.3)
Chloride: 104 mmol/L (ref 98–111)
Creatinine, Ser: 0.65 mg/dL (ref 0.44–1.00)
GFR calc Af Amer: >60 mL/min (ref >60)
GFR calc non Af Amer: >60 mL/min (ref >60)
Glucose, Bld: 102 mg/dL — ABNORMAL HIGH (ref 70–99)
Potassium: 3.9 mmol/L (ref 3.5–5.1)
Sodium: 139 mmol/L (ref 135–145)
Total Bilirubin: 0.2 mg/dL — ABNORMAL LOW (ref 0.3–1.2)
Total Protein: 6.7 g/dL (ref 6.5–8.1)

## 2018-09-23 LAB — CBC
HCT: 37.7 % (ref 36.0–46.0)
Hemoglobin: 12.1 g/dL (ref 12.0–15.0)
MCH: 28.4 pg (ref 26.0–34.0)
MCHC: 32.1 g/dL (ref 30.0–36.0)
MCV: 88.5 fL (ref 80.0–100.0)
Platelets: 329 10*3/uL (ref 150–400)
RBC: 4.26 MIL/uL (ref 3.87–5.11)
RDW: 12.5 % (ref 11.5–15.5)
WBC: 9.7 10*3/uL (ref 4.0–10.5)
nRBC: 0 % (ref 0.0–0.2)

## 2018-09-23 LAB — LIPASE, BLOOD: Lipase: 31 U/L (ref 11–51)

## 2018-09-23 MED ORDER — IOHEXOL 300 MG/ML  SOLN
100.0000 mL | Freq: Once | INTRAMUSCULAR | Status: AC | PRN
  Administered 2018-09-23: 100 mL via INTRAVENOUS

## 2018-09-23 MED ORDER — ONDANSETRON HCL 4 MG/2ML IJ SOLN
4.0000 mg | Freq: Once | INTRAMUSCULAR | Status: AC
  Administered 2018-09-23: 4 mg via INTRAVENOUS
  Filled 2018-09-23: qty 2

## 2018-09-23 MED ORDER — MORPHINE SULFATE (PF) 4 MG/ML IV SOLN
4.0000 mg | Freq: Once | INTRAVENOUS | Status: AC
  Administered 2018-09-23: 4 mg via INTRAVENOUS
  Filled 2018-09-23: qty 1

## 2018-09-23 MED ORDER — SODIUM CHLORIDE 0.9 % IV BOLUS
1000.0000 mL | Freq: Once | INTRAVENOUS | Status: AC
  Administered 2018-09-23: 1000 mL via INTRAVENOUS

## 2018-09-23 NOTE — ED Notes (Signed)
Pt transported to CT ?

## 2018-09-23 NOTE — ED Triage Notes (Signed)
Pt arrived to ED c/o LLQ pain x4 days that has worsened today with N/V/D. Pt has been seen by her PCP for episodes of chronic diarrhea without a dx yet.

## 2018-09-23 NOTE — ED Provider Notes (Signed)
[facility] DEPT Provider Note   CSN: [id number] Arrival date & time: 09/23/18  1804     History   Chief Complaint Chief Complaint  Patient presents with   Abdominal Pain   Emesis    HPI Dayle-Ann Mees is a 63 y.o. female with a hx of tobacco abuse, COPD, fibromyalgia, prior DVT, hyperlipidemia, & prior cholecystectomy, appendectomy & total abdominal hysterectomy who presents to the ED w/ complaints of abdominal pain x 4 days. Patient reports pain is a stabbing sensation that has been waxing/waning to the LLQ of her abdomen. Pain at times radiates to the RLQ. Pain is currently severe, worse with movement or really anything per the patient, no alleviating factors. Reports associated nausea w/ 3 episodes of emesis in the past 24 hours. States she has had issues w/ diarrhea x 1 year- outpatient stool sample testing normal, unclear cause, states this has increased mildly. Has had some sweats, but no documented fevers. She has had cough & some dyspnea which are not necessarily atypical for her COPD. Denies chills, hematemesis, melena, hematochezia, chest pain, or dysuria. NO recent travel, abx, or hospitalizations      HPI  Past Medical History:  Diagnosis Date   Anxiety    Bronchitis    Chest pain    a. reports 3 nl stress tests in [place] over past 10 years.   COPD (chronic obstructive pulmonary disease) (HCC)    Depression    DVT (deep venous thrombosis) ([place])    a. 2016 R DVT after knee surgery  tx w/ coumadin x 2 mos.   Fibromyalgia    GERD (gastroesophageal reflux disease)    Hyperlipidemia    Hypothyroidism    Pneumonia    Seasonal allergies    Snoring    a. sometimes awakes @ night gasping.    Patient Active Problem List   Diagnosis Date Noted   COPD GOLD 0 06/23/2018   GERD (gastroesophageal reflux disease) 06/23/2018   Cigarette smoker 05/05/2018   Obesity (BMI 30-39.9) 05/05/2018   DOE (dyspnea on exertion)  05/04/2018    Past Surgical History:  Procedure Laterality Date   CARPAL TUNNEL RELEASE Right    CERVICAL SPINE SURGERY     CHOLECYSTECTOMY     ELBOW SURGERY Right    FOOT SURGERY Right    KNEE ARTHROSCOPY Bilateral    SHOULDER SURGERY Right    TOTAL ABDOMINAL HYSTERECTOMY     TOTAL KNEE ARTHROPLASTY Right      OB History   No obstetric history on file.      Home Medications    Prior to Admission medications   Medication Sig Start Date End Date Taking? Authorizing Provider  albuterol (PROVENTIL HFA;VENTOLIN HFA) 108 (90 Base) MCG/ACT inhaler Inhale 1-2 puffs into the lungs every 6 (six) hours as needed for wheezing or shortness of breath.  06/10/18   [provider]  amitriptyline (ELAVIL) 25 MG tablet Take 25 mg by mouth at bedtime.    [provider]  baclofen (LIORESAL) 10 MG tablet Take 20 mg by mouth 3 (three) times daily.     [provider]  budesonide-formoterol (SYMBICORT) 160-4.5 MCG/ACT inhaler Inhale 2 puffs into the lungs 2 (two) times daily. 06/10/18   [provider]  Camphor-Menthol-Methyl Sal (TIGER BALM MUSCLE RUB EX) Apply 1 application topically 2 (two) times daily as needed (muscle pain).    [provider]  Cholecalciferol (DIALYVITE VITAMIN D 5000 PO) Take 5,000 Units by mouth daily.  [provider]  DULoxetine (CYMBALTA) 60 MG capsule Take 60 mg by mouth daily.    [provider]  HYDROcodone-acetaminophen (NORCO/VICODIN) 5-325 MG tablet Take 1 tablet by mouth every 4 (four) hours as needed. 06/14/18   Maudie Flakes, MD  ibuprofen (ADVIL,MOTRIN) 200 MG tablet Take 600 mg by mouth every 6 (six) hours as needed for moderate pain.     [provider]  lansoprazole (PREVACID) 30 MG capsule Take 30- 60 min before your first and last meals of the day 06/22/18   Tanda Rockers, MD  levothyroxine (SYNTHROID, LEVOTHROID) 100 MCG tablet Take 100 mcg by mouth daily before breakfast.     [provider]  lidocaine (LIDODERM) 5 % Place 1 patch onto the skin daily. Remove & Discard patch within 12 hours or as directed by MD 06/14/18   Maudie Flakes, MD  pravastatin (PRAVACHOL) 40 MG tablet Take 40 mg by mouth daily.    [provider]  traZODone (DESYREL) 100 MG tablet Take 100 mg by mouth at bedtime.    [provider]  vitamin B-12 (CYANOCOBALAMIN) 1000 MCG tablet Take 1,000 mcg by mouth daily.    [provider]    Family History Family History  Problem Relation Age of Onset   Atrial fibrillation Mother     Social History Social History   Tobacco Use   Smoking status: Current Every Day Smoker    Packs/day: 0.50    Years: 40.00    Pack years: 20.00    Types: Cigarettes   Smokeless tobacco: Never Used   Tobacco comment: currently 1/2 ppd 05/04/2018//lmr  Substance Use Topics   Alcohol use: No   Drug use: Not Currently    Types: Marijuana    Comment: smoked marijuana 2 x daily 05/04/2018//lmr     Allergies   Tetracyclines & related   Review of Systems Review of Systems  Constitutional: Negative for fever.  Respiratory: Positive for cough and shortness of breath.   Cardiovascular: Negative for chest pain.  Gastrointestinal: Positive for abdominal pain, diarrhea, nausea and vomiting. Negative for anal bleeding, blood in stool and constipation.  Genitourinary: Negative for dysuria and vaginal bleeding.  Skin: Negative for rash.  Neurological: Negative for syncope.  All other systems reviewed and are negative.  Physical Exam Updated Vital Signs BP 132/74 (BP Location: Right Arm)    Pulse (!) 115    Temp 98.4 F (36.9 C) (Oral)    Resp 19    SpO2 98%   Physical Exam Vitals signs and nursing note reviewed.  Constitutional:      General: She is not in acute distress.    Appearance: She is well-developed. She is not toxic-appearing.  HENT:     Head: Normocephalic and atraumatic.  Eyes:     General:        Right  eye: No discharge.        Left eye: No discharge.     Conjunctiva/sclera: Conjunctivae normal.  Neck:     Musculoskeletal: Neck supple.  Cardiovascular:     Rate and Rhythm: Normal rate and regular rhythm.  Pulmonary:     Effort: Pulmonary effort is normal. No respiratory distress.     Breath sounds: Normal breath sounds. No wheezing, rhonchi or rales.  Abdominal:     General: There is no distension.     Palpations: Abdomen is soft.     Tenderness: There is abdominal tenderness (primarily in the LLQ, mild to LUQ).  Skin:    General: Skin is warm and dry.     Findings: No rash.  Neurological:     Mental Status: She is alert.     Comments: Clear speech.   Psychiatric:        Behavior: Behavior normal.    ED Treatments / Results  Labs (all labs ordered are listed, but only abnormal results are displayed) Labs Reviewed  COMPREHENSIVE METABOLIC PANEL - Abnormal; Notable for the following components:      Result Value   Glucose, Bld 102 (*)    BUN 7 (*)    Total Bilirubin 0.2 (*)    All other components within normal limits  NOVEL CORONAVIRUS, NAA (HOSPITAL ORDER, SEND-OUT TO REF LAB)  LIPASE, BLOOD  CBC  URINALYSIS, ROUTINE W REFLEX MICROSCOPIC    EKG None  Radiology Ct Abdomen Pelvis W Contrast  Result Date: 09/23/2018 CLINICAL DATA:  Left lower quadrant pain for several days EXAM: CT ABDOMEN AND PELVIS WITH CONTRAST TECHNIQUE: Multidetector CT imaging of the abdomen and pelvis was performed using the standard protocol following bolus administration of intravenous contrast. CONTRAST:  128mL OMNIPAQUE IOHEXOL 300 MG/ML  SOLN COMPARISON:  None. FINDINGS: Lower chest: Lung bases demonstrate some patchy infiltrate bilaterally. 10 mm nodule is noted in the right lower lobe. Hepatobiliary: Gallbladder has been surgically removed. The liver is diffusely decreased in attenuation consistent with fatty infiltration. Pancreas: Unremarkable. No pancreatic ductal dilatation or  surrounding inflammatory changes. Spleen: Normal in size without focal abnormality. Adrenals/Urinary Tract: Adrenal glands are within normal limits. The kidneys are well visualized bilaterally without renal calculi or obstructive changes. Small left renal cysts are noted on the delayed images. The bladder is partially distended. Stomach/Bowel: Scattered diverticular change in the colon is noted without evidence of diverticulitis. Smooth muscle hypertrophy is noted within the sigmoid colon related to the diverticular disease. The appendix is not well visualized. No inflammatory changes to suggest appendicitis are noted. Small bowel and stomach are within normal limits. Vascular/Lymphatic: Aortic atherosclerosis. No enlarged abdominal or pelvic lymph nodes. Reproductive: Status post hysterectomy. No adnexal masses. Other: No abdominal wall hernia or abnormality. No abdominopelvic ascites. Musculoskeletal: No acute or significant osseous findings. IMPRESSION: Fatty liver. Diverticulosis without diverticulitis. Lung bases demonstrate some patchy infiltrative change. This may represent atypical pneumonia. 10 mm right lower lobe nodule. Consider one of the following in 3 months for both low-risk and high-risk individuals: (a) repeat chest CT, (b) follow-up PET-CT, or (c) tissue sampling. This recommendation follows the consensus statement: Guidelines for Management of Incidental Pulmonary Nodules Detected on CT Images: From the Fleischner Society 2017; Radiology 2017; 284:228-243. Electronically Signed   By: Inez Catalina M.D.   On: 09/23/2018 23:30   Dg Chest Port 1 View  Result Date: 09/24/2018 CLINICAL DATA:  63 year old female with cough. EXAM: PORTABLE CHEST 1 VIEW COMPARISON:  Chest radiograph dated 06/10/2018 FINDINGS: Mild diffuse interstitial prominence and hazy densities primarily involving the left mid to lower lung field relatively similar to prior radiograph. Findings may be chronic. Atypical infection is  not excluded. Clinical correlation is recommended. No focal consolidation, pleural effusion, or pneumothorax. Stable cardiac silhouette. No acute osseous pathology. Partially visualized cervical ACDF. IMPRESSION: 1. No significant interval change. 2. Chronic interstitial changes primarily involving the left lung. Atypical infection is not entirely excluded. Clinical correlation is recommended. Electronically Signed   By: Anner Crete M.D.   On: 09/24/2018 01:31    Procedures Procedures (including critical care time)  Medications Ordered in ED  Medications  sodium chloride 0.9 % bolus 1,000 mL (0 mLs Intravenous Stopped 09/23/18 2340)  ondansetron (ZOFRAN) injection 4 mg (4 mg Intravenous Given 09/23/18 2233)  morphine 4 MG/ML injection 4 mg (4 mg Intravenous Given 09/23/18 2233)  iohexol (OMNIPAQUE) 300 MG/ML solution 100 mL (100 mLs Intravenous Contrast Given 09/23/18 2302)  HYDROmorphone (DILAUDID) injection 1 mg (1 mg Intravenous Given 09/24/18 0112)  dexamethasone (DECADRON) injection 10 mg (10 mg Intravenous Given 09/24/18 0111)  albuterol (VENTOLIN HFA) 108 (90 Base) MCG/ACT inhaler 1-2 puff (2 puffs Inhalation Given 09/24/18 0243)     Initial Impression / Assessment and Plan / ED Course  I have reviewed the triage vital signs and the nursing notes.  Pertinent labs & imaging results that were available during my care of the patient were reviewed by me and considered in my medical decision making (see chart for details).    Patient presents to the ED w/ complaints of abdominal pain. She is nontoxic appearing, in no apparent distress, vitals w/ elevated HR initially normalized on my exam. Abdomen is soft, tender to palpation primarily in LLQ, mild in LUQ, no peritoneal signs.   Work-up per triage reviewed thus far:  CBC: No leukocytosis or anemia.  CMP: No electrolyte derangement. LFTs & creatinine WNL.  Lipase: WNL UA: no UTI  Analgesics, anti-emetics, & fluids ordered. Will further  assess w/ CT abdomen/pelvis.  CT abdomen/pelvis: Fatty liver. Diverticulosis without diverticulitis. Lung bases demonstrate some patchy infiltrative change. This may represent atypical pneumonia. 10 mm right lower lobe nodule. Consider follow up imaging as detailed above.    Lungs remain clear. There is concern for covid 19 w/ patient's GI sxs as well as her reported sweats & mild respiratory sxs which she states are fairly baseline for her COPD. CXR with atypical infection not entirely excluded clinical correlation recommended. Given her array of sxs as well as lung findings on CT- more detailed form of imaging- will cover for CAP w/ azithromycin & amoxicillin per discussion w/ Dr. Roxanne Mins. She is not wheezing on exam, she is not hypoxic @ rest or w/ ambulation- does not seem like a COPD exacerbation. Given concern for covid w/ her hx of COPD will given decadron in the ED, she needs a refill of albuterol inhaler which was given here. She is tolerating PO, repeat abdominal exam remains w/o peritoneal signs. Pain improved. Unclear definitive etiology, covid swab pending, symptomatic management w/ quarantine instructions. I discussed results, treatment plan, need for follow-up, and strict return precautions with the patient. Provided opportunity for questions, patient confirmed understanding and is in agreement with plan.    Findings and plan of care discussed with supervising physician Dr. Roxanne Mins who is in agreement.   Vitals:   09/24/18 0000 09/24/18 0234  BP: 120/77 113/63  Pulse: 90 87  Resp: 18 16  Temp:    SpO2: 96% 97%    Final Clinical Impressions(s) / ED Diagnoses   Final diagnoses:  Nausea vomiting and diarrhea  Left lower quadrant abdominal pain  Community acquired pneumonia, unspecified laterality  Lung nodule    ED Discharge Orders         Ordered    ondansetron (ZOFRAN ODT) 4 MG disintegrating tablet  Every 8 hours PRN     09/24/18 0235    HYDROcodone-acetaminophen  (NORCO/VICODIN) 5-325 MG tablet  Every 6 hours PRN     09/24/18 0235    azithromycin (ZITHROMAX) 250 MG tablet  Daily     09/24/18 0235  amoxicillin (AMOXIL) 500 MG capsule  3 times daily     09/24/18 0235           Amaryllis Dyke, PA-C 86/48/47 2072    Delora Fuel, MD 18/28/83 1328

## 2018-09-24 ENCOUNTER — Ambulatory Visit: Payer: Medicare Other | Admitting: Internal Medicine

## 2018-09-24 ENCOUNTER — Emergency Department (HOSPITAL_COMMUNITY): Payer: Medicare Other

## 2018-09-24 DIAGNOSIS — R05 Cough: Secondary | ICD-10-CM | POA: Diagnosis not present

## 2018-09-24 MED ORDER — ALBUTEROL SULFATE HFA 108 (90 BASE) MCG/ACT IN AERS
1.0000 | INHALATION_SPRAY | Freq: Once | RESPIRATORY_TRACT | Status: AC
  Administered 2018-09-24: 2 via RESPIRATORY_TRACT
  Filled 2018-09-24: qty 6.7

## 2018-09-24 MED ORDER — DEXAMETHASONE SODIUM PHOSPHATE 10 MG/ML IJ SOLN
10.0000 mg | Freq: Once | INTRAMUSCULAR | Status: AC
  Administered 2018-09-24: 10 mg via INTRAVENOUS
  Filled 2018-09-24: qty 1

## 2018-09-24 MED ORDER — AMOXICILLIN 500 MG PO CAPS: 1000.0000 mg | ORAL_CAPSULE | Freq: Three times a day (TID) | ORAL | 0 refills | Status: AC

## 2018-09-24 MED ORDER — HYDROCODONE-ACETAMINOPHEN 5-325 MG PO TABS: 1.0000 | ORAL_TABLET | Freq: Four times a day (QID) | ORAL | 0 refills | Status: AC | PRN

## 2018-09-24 MED ORDER — HYDROMORPHONE HCL 1 MG/ML IJ SOLN
1.0000 mg | Freq: Once | INTRAMUSCULAR | Status: AC
  Administered 2018-09-24: 1 mg via INTRAVENOUS
  Filled 2018-09-24: qty 1

## 2018-09-24 MED ORDER — AZITHROMYCIN 250 MG PO TABS: 250.0000 mg | ORAL_TABLET | Freq: Every day | ORAL | 0 refills | Status: AC

## 2018-09-24 MED ORDER — ONDANSETRON 4 MG PO TBDP: 4.0000 mg | ORAL_TABLET | Freq: Three times a day (TID) | ORAL | 0 refills | Status: AC | PRN

## 2018-09-24 NOTE — ED Notes (Signed)
PO Challenge: Pt has been sipping on water and tolerated well.

## 2018-09-24 NOTE — ED Notes (Signed)
Pt rolled over and pulled IV out. Dressing applied. Pt denies pain at site of IV.

## 2018-09-24 NOTE — Discharge Instructions (Addendum)
You were seen in the emergency department today for abdominal pain.  Your work-up was overall reassuring.  Your labs did not show significant abnormality.  Your CT scan showed the following: Fatty liver.     Diverticulosis without diverticulitis.     Lung bases demonstrate some patchy infiltrative change. This may  represent atypical pneumonia.     10 mm right lower lobe nodule. Consider one of the following in 3  months for both low-risk and high-risk individuals: (a) repeat chest  CT, (b) follow-up PET-CT, or (c) tissue sampling. This  recommendation follows the consensus statement: Guidelines for  Management of Incidental Pulmonary Nodules Detected on CT Images:  From the Fleischner Society 2017; Radiology 2017; 284:228-243.      Your CT scan did not show acute problems in your abdomen.  It did show some changes concerning for an atypical pneumonia.  We are placing you on azithromycin and amoxicillin, these are antibiotics, to treat pneumonia.  Please take these as prescribed.  Your CT scan did show a 10 mm right lower lobe nodule, this is something that will need repeat imaging and or further evaluation, please discuss this with your primary care provider.  We are also sending you home with Norco to take as needed for pain & zofran to take as needed for nausea/vomiting.   -Norco-this is a narcotic/controlled substance medication that has potential addicting qualities.  We recommend that you take 1-2 tablets every 6 hours as needed for severe pain.  Do not drive or operate heavy machinery when taking this medicine as it can be sedating. Do not drink alcohol or take other sedating medications when taking this medicine for safety reasons.  Keep this out of reach of small children.  Please be aware this medicine has Tylenol in it (325 mg/tab) do not exceed the maximum dose of Tylenol in a day per over the counter recommendations should you decide to supplement with Tylenol over the counter.    We have prescribed you new medication(s) today. Discuss the medications prescribed today with your pharmacist as they can have adverse effects and interactions with your other medicines including over the counter and prescribed medications. Seek medical evaluation if you start to experience new or abnormal symptoms after taking one of these medicines, seek care immediately if you start to experience difficulty breathing, feeling of your throat closing, facial swelling, or rash as these could be indications of a more serious allergic reaction   We have tested you for coronavirus. We will call you with results.   WE are instructing anyone with coronavirus to quarantine themselves for 14 days. You may be able to discontinue self quarantine if the following conditions are met:   Persons with COVID-19 who have symptoms and were directed to care for themselves at home may discontinue home isolation under the  following conditions: - It has been at least 7 days have passed since symptoms first appeared. - AND at least 3 days (72 hours) have passed since recovery defined as resolution of fever without the use of fever-reducing medications and improvement in respiratory symptoms (e.g., cough, shortness of breath)  Please follow the below quarantine instructions.   Please follow up with primary care within 3-5 days for re-evaluation- call prior to going to the office to make them aware of your symptoms. Return to the ER for new or worsening symptoms including but not limited to increased work of breathing, fever, chest pain, passing out, inability to keep fluids down, worsened pain,  change in location of pain, blood in your stool, or any other concerns.

## 2018-09-25 LAB — NOVEL CORONAVIRUS, NAA (HOSP ORDER, SEND-OUT TO REF LAB; TAT 18-24 HRS): SARS-CoV-2, NAA: NOT DETECTED

## 2018-10-16 DIAGNOSIS — F419 Anxiety disorder, unspecified: Secondary | ICD-10-CM | POA: Diagnosis not present

## 2018-10-16 DIAGNOSIS — F3341 Major depressive disorder, recurrent, in partial remission: Secondary | ICD-10-CM | POA: Diagnosis not present

## 2018-10-16 DIAGNOSIS — E785 Hyperlipidemia, unspecified: Secondary | ICD-10-CM | POA: Diagnosis not present

## 2018-10-16 DIAGNOSIS — E039 Hypothyroidism, unspecified: Secondary | ICD-10-CM | POA: Diagnosis not present

## 2018-10-16 DIAGNOSIS — M797 Fibromyalgia: Secondary | ICD-10-CM | POA: Diagnosis not present

## 2018-11-10 DIAGNOSIS — J441 Chronic obstructive pulmonary disease with (acute) exacerbation: Secondary | ICD-10-CM | POA: Diagnosis not present

## 2018-11-25 DIAGNOSIS — B370 Candidal stomatitis: Secondary | ICD-10-CM | POA: Diagnosis not present

## 2018-11-25 DIAGNOSIS — Z23 Encounter for immunization: Secondary | ICD-10-CM | POA: Diagnosis not present

## 2018-11-25 DIAGNOSIS — M62838 Other muscle spasm: Secondary | ICD-10-CM | POA: Diagnosis not present

## 2018-11-25 DIAGNOSIS — K219 Gastro-esophageal reflux disease without esophagitis: Secondary | ICD-10-CM | POA: Diagnosis not present

## 2018-11-25 DIAGNOSIS — F418 Other specified anxiety disorders: Secondary | ICD-10-CM | POA: Diagnosis not present

## 2018-11-25 DIAGNOSIS — E039 Hypothyroidism, unspecified: Secondary | ICD-10-CM | POA: Diagnosis not present

## 2018-11-25 DIAGNOSIS — J441 Chronic obstructive pulmonary disease with (acute) exacerbation: Secondary | ICD-10-CM | POA: Diagnosis not present

## 2018-11-25 DIAGNOSIS — J449 Chronic obstructive pulmonary disease, unspecified: Secondary | ICD-10-CM | POA: Diagnosis not present

## 2018-11-25 DIAGNOSIS — F17210 Nicotine dependence, cigarettes, uncomplicated: Secondary | ICD-10-CM | POA: Diagnosis not present

## 2018-11-25 DIAGNOSIS — Z1159 Encounter for screening for other viral diseases: Secondary | ICD-10-CM | POA: Diagnosis not present

## 2018-11-25 DIAGNOSIS — R309 Painful micturition, unspecified: Secondary | ICD-10-CM | POA: Diagnosis not present

## 2018-11-25 DIAGNOSIS — E782 Mixed hyperlipidemia: Secondary | ICD-10-CM | POA: Diagnosis not present

## 2018-11-25 DIAGNOSIS — Z79899 Other long term (current) drug therapy: Secondary | ICD-10-CM | POA: Diagnosis not present

## 2018-12-02 DIAGNOSIS — E039 Hypothyroidism, unspecified: Secondary | ICD-10-CM | POA: Diagnosis not present

## 2018-12-02 DIAGNOSIS — M797 Fibromyalgia: Secondary | ICD-10-CM | POA: Diagnosis not present

## 2018-12-02 DIAGNOSIS — R0600 Dyspnea, unspecified: Secondary | ICD-10-CM | POA: Diagnosis not present

## 2018-12-02 DIAGNOSIS — R7303 Prediabetes: Secondary | ICD-10-CM | POA: Diagnosis not present

## 2018-12-02 DIAGNOSIS — J441 Chronic obstructive pulmonary disease with (acute) exacerbation: Secondary | ICD-10-CM | POA: Diagnosis not present

## 2018-12-02 DIAGNOSIS — Z1231 Encounter for screening mammogram for malignant neoplasm of breast: Secondary | ICD-10-CM | POA: Diagnosis not present

## 2018-12-02 DIAGNOSIS — Z1389 Encounter for screening for other disorder: Secondary | ICD-10-CM | POA: Diagnosis not present

## 2018-12-02 DIAGNOSIS — Z1331 Encounter for screening for depression: Secondary | ICD-10-CM | POA: Diagnosis not present

## 2018-12-02 DIAGNOSIS — F17210 Nicotine dependence, cigarettes, uncomplicated: Secondary | ICD-10-CM | POA: Diagnosis not present

## 2018-12-02 DIAGNOSIS — Z0000 Encounter for general adult medical examination without abnormal findings: Secondary | ICD-10-CM | POA: Diagnosis not present

## 2019-07-24 ENCOUNTER — Other Ambulatory Visit: Payer: Self-pay | Admitting: Internal Medicine

## 2019-07-24 DIAGNOSIS — K219 Gastro-esophageal reflux disease without esophagitis: Secondary | ICD-10-CM
